# Patient Record
Sex: Male | Born: 1965 | Race: White | Hispanic: No | Marital: Married | Smoking: Never smoker
Health system: Southern US, Community
[De-identification: ages and names within clinical notes are randomized; demographics above are authoritative.]

## PROBLEM LIST (undated history)

## (undated) DIAGNOSIS — E119 Type 2 diabetes mellitus without complications: Secondary | ICD-10-CM

## (undated) DIAGNOSIS — G4730 Sleep apnea, unspecified: Secondary | ICD-10-CM

## (undated) HISTORY — PX: CHOLECYSTECTOMY: SHX55

---

## 2014-04-12 ENCOUNTER — Emergency Department
Admit: 2014-04-12 | Disposition: A | Discharge status: Home / Self Care | Payer: Self-pay | Admitting: Emergency Medicine

## 2014-09-19 LAB — HM HIV SCREENING LAB: HM HIV Screening: NEGATIVE

## 2014-10-16 ENCOUNTER — Ambulatory Visit: Payer: Self-pay | Admitting: Family Medicine

## 2015-03-10 ENCOUNTER — Telehealth: Payer: Self-pay | Admitting: Gastroenterology

## 2015-03-10 ENCOUNTER — Other Ambulatory Visit: Payer: Self-pay

## 2015-03-10 NOTE — Telephone Encounter (Signed)
Pt scheduled for screening colonoscopy at [facility] on 03/19/15. Instructs/rx mailed. Please check precert.

## 2015-03-10 NOTE — Telephone Encounter (Signed)
Gastroenterology Pre-Procedure Review  Request Date:  Requesting Physician: Dr.   PATIENT REVIEW QUESTIONS: The patient responded to the following health history questions as indicated:    1. Are you having any GI issues? no 2. Do you have a personal history of Polyps? no 3. Do you have a family history of Colon Cancer or Polyps? no 4. Diabetes Mellitus? no 5. Joint replacements in the past 12 months?no 6. Major health problems in the past 3 months?no 7. Any artificial heart valves, MVP, or defibrillator?no    MEDICATIONS & ALLERGIES:    Patient reports the following regarding taking any anticoagulation/antiplatelet therapy:   Plavix, Coumadin, Eliquis, Xarelto, Lovenox, Pradaxa, Brilinta, or Effient? no Aspirin? no  Patient confirms/reports the following medications:  No current outpatient prescriptions on file.   No current facility-administered medications for this visit.    Patient confirms/reports the following allergies:  Allergies not on file  No orders of the defined types were placed in this encounter.    AUTHORIZATION INFORMATION Primary Insurance: 1D#: Group #:  Secondary Insurance: 1D#: Group #:  SCHEDULE INFORMATION: Date: 03/19/15 Time: Location: [place]

## 2015-03-10 NOTE — Telephone Encounter (Signed)
Self Referral. Patient has [facility] and is over 48, never had a colonoscopy. Please call for colonoscopy triage. Call his cell# [phone]. Thanks.

## 2015-03-12 ENCOUNTER — Encounter: Payer: Self-pay | Admitting: *Deleted

## 2015-03-13 ENCOUNTER — Telehealth: Payer: Self-pay | Admitting: Gastroenterology

## 2015-03-13 NOTE — Telephone Encounter (Signed)
Patient did not receive any info and RX in the mail for his colonoscopy. Please call

## 2015-03-16 NOTE — Telephone Encounter (Signed)
Contacted pt and he had received his paperwork already. No need to resend.

## 2015-03-18 NOTE — Discharge Instructions (Signed)

## 2015-03-19 ENCOUNTER — Ambulatory Visit: Payer: BLUE CROSS/BLUE SHIELD | Admitting: Anesthesiology

## 2015-03-19 ENCOUNTER — Ambulatory Visit
Admission: RE | Admit: 2015-03-19 | Discharge: 2015-03-19 | Disposition: A | Discharge status: Home / Self Care | Payer: BLUE CROSS/BLUE SHIELD | Source: Ambulatory Visit | Attending: Gastroenterology | Admitting: Gastroenterology

## 2015-03-19 ENCOUNTER — Encounter
Admission: RE | Disposition: A | Discharge status: Home / Self Care | Payer: Self-pay | Source: Ambulatory Visit | Attending: Gastroenterology

## 2015-03-19 DIAGNOSIS — Z1211 Encounter for screening for malignant neoplasm of colon: Secondary | ICD-10-CM | POA: Diagnosis not present

## 2015-03-19 DIAGNOSIS — D125 Benign neoplasm of sigmoid colon: Secondary | ICD-10-CM | POA: Insufficient documentation

## 2015-03-19 DIAGNOSIS — K635 Polyp of colon: Secondary | ICD-10-CM | POA: Insufficient documentation

## 2015-03-19 DIAGNOSIS — Z9049 Acquired absence of other specified parts of digestive tract: Secondary | ICD-10-CM | POA: Insufficient documentation

## 2015-03-19 DIAGNOSIS — D124 Benign neoplasm of descending colon: Secondary | ICD-10-CM | POA: Diagnosis not present

## 2015-03-19 HISTORY — PX: COLONOSCOPY WITH PROPOFOL: SHX5780

## 2015-03-19 HISTORY — PX: POLYPECTOMY: SHX5525

## 2015-03-19 SURGERY — COLONOSCOPY WITH PROPOFOL
Anesthesia: Monitor Anesthesia Care | Wound class: Contaminated

## 2015-03-19 MED ORDER — ONDANSETRON HCL 4 MG/2ML IJ SOLN: 4.0000 mg | Freq: Once | INTRAMUSCULAR | Status: DC | PRN

## 2015-03-19 MED ORDER — ACETAMINOPHEN 325 MG PO TABS: 325.0000 mg | ORAL_TABLET | ORAL | Status: DC | PRN

## 2015-03-19 MED ORDER — LIDOCAINE HCL (CARDIAC) 20 MG/ML IV SOLN
INTRAVENOUS | Status: DC | PRN
  Administered 2015-03-19: 50 mg via INTRAVENOUS

## 2015-03-19 MED ORDER — ACETAMINOPHEN 160 MG/5ML PO SOLN: 325.0000 mg | ORAL | Status: DC | PRN

## 2015-03-19 MED ORDER — PROPOFOL 10 MG/ML IV BOLUS
INTRAVENOUS | Status: DC | PRN
  Administered 2015-03-19: 20 mg via INTRAVENOUS
  Administered 2015-03-19: 30 mg via INTRAVENOUS
  Administered 2015-03-19 (×2): 20 mg via INTRAVENOUS
  Administered 2015-03-19: 30 mg via INTRAVENOUS
  Administered 2015-03-19: 150 mg via INTRAVENOUS
  Administered 2015-03-19 (×2): 20 mg via INTRAVENOUS
  Administered 2015-03-19: 50 mg via INTRAVENOUS

## 2015-03-19 MED ORDER — LACTATED RINGERS IV SOLN
INTRAVENOUS | Status: DC
  Administered 2015-03-19: 10:00:00 via INTRAVENOUS

## 2015-03-19 MED ORDER — STERILE WATER FOR IRRIGATION IR SOLN
Status: DC | PRN
  Administered 2015-03-19: 10:00:00

## 2015-03-19 SURGICAL SUPPLY — 28 items

## 2015-03-19 NOTE — Transfer of Care (Signed)
Immediate Anesthesia Transfer of Care Note  Patient: Sergio Fleming  Procedure(s) Performed: Procedure(s): COLONOSCOPY WITH PROPOFOL (N/A) POLYPECTOMY  Patient Location: PACU  Anesthesia Type: MAC  Level of Consciousness: awake, alert  and patient cooperative  Airway and Oxygen Therapy: Patient Spontanous Breathing and Patient connected to supplemental oxygen  Post-op Assessment: Post-op Vital signs reviewed, Patient's Cardiovascular Status Stable, Respiratory Function Stable, Patent Airway and No signs of Nausea or vomiting  Post-op Vital Signs: Reviewed and stable  Complications: No apparent anesthesia complications

## 2015-03-19 NOTE — Addendum Note (Signed)
Addendum  created 03/19/15 1102 by Mayme Genta, CRNA   Modules edited: Notes Section   Notes Section:  Pend: [id number]

## 2015-03-19 NOTE — Op Note (Signed)
[facility] Gastroenterology Patient Name: Sergio Fleming Procedure Date: 03/19/2015 9:55 AM MRN: [id number] Account #: [id number] Date of Birth: Jul 28, 1965 Admit Type: Outpatient Age: 50 Room: [facility] OR ROOM 01 Gender: Male Note Status: Finalized Procedure:            Colonoscopy Indications:          Screening for colorectal malignant neoplasm Providers:            Lucilla Lame, MD Referring MD:         Scot [facility] (Referring MD) Medicines:            Propofol per Anesthesia Complications:        No immediate complications. Procedure:            Pre-Anesthesia Assessment:                       - Prior to the procedure, a History and Physical was                        performed, and patient medications and allergies were                        reviewed. The patient's tolerance of previous                        anesthesia was also reviewed. The risks and benefits of                        the procedure and the sedation options and risks were                        discussed with the patient. All questions were                        answered, and informed consent was obtained. Prior                        Anticoagulants: The patient has taken no previous                        anticoagulant or antiplatelet agents. ASA Grade                        Assessment: II - A patient with mild systemic disease.                        After reviewing the risks and benefits, the patient was                        deemed in satisfactory condition to undergo the                        procedure.                       After obtaining informed consent, the colonoscope was                        passed under direct vision. Throughout the procedure,  the patient's blood pressure, pulse, and oxygen                        saturations were monitored continuously. The was                        introduced through the anus and advanced to the the      cecum, identified by appendiceal orifice and ileocecal                        valve. The colonoscopy was performed without                        difficulty. The patient tolerated the procedure well.                        The quality of the bowel preparation was excellent. Findings:      The perianal and digital rectal examinations were normal.      A 3 mm polyp was found in the sigmoid colon. The polyp was sessile. The       polyp was removed with a cold snare. Resection and retrieval were       complete.      A 6 mm polyp was found in the descending colon. The polyp was sessile.       The polyp was removed with a cold snare. Resection and retrieval were       complete. Impression:           - One 3 mm polyp in the sigmoid colon, removed with a                        cold snare. Resected and retrieved.                       - One 6 mm polyp in the descending colon, removed with                        a cold snare. Resected and retrieved. Recommendation:       - Await pathology results.                       - Repeat colonoscopy in 5 years if polyp adenoma and 10                        years if hyperplastic Procedure Code(s):    --- Professional ---                       [phone], Colonoscopy, flexible; with removal of tumor(s),                        polyp(s), or other lesion(s) by snare technique Diagnosis Code(s):    --- Professional ---                       Z12.11, Encounter for screening for malignant neoplasm                        of colon  D12.5, Benign neoplasm of sigmoid colon                       D12.4, Benign neoplasm of descending colon CPT copyright 2016 American Medical Association. All rights reserved. The codes documented in this report are preliminary and upon coder review may  be revised to meet current compliance requirements. Lucilla Lame, MD 03/19/2015 10:17:39 AM This report has been signed electronically. Number of Addenda: 0 Note  Initiated On: 03/19/2015 9:55 AM Scope Withdrawal Time: 0 hours 6 minutes 12 seconds  Total Procedure Duration: 0 hours 10 minutes 36 seconds       [facility]

## 2015-03-19 NOTE — Anesthesia Postprocedure Evaluation (Signed)
Anesthesia Post Note  Patient: Sergio Fleming  Procedure(s) Performed: Procedure(s) (LRB): COLONOSCOPY WITH PROPOFOL (N/A) POLYPECTOMY  Patient location during evaluation: PACU Anesthesia Type: MAC Level of consciousness: awake and alert Pain management: pain level controlled Vital Signs Assessment: post-procedure vital signs reviewed and stable Respiratory status: spontaneous breathing, nonlabored ventilation, respiratory function stable and patient connected to nasal cannula oxygen Cardiovascular status: stable and blood pressure returned to baseline Anesthetic complications: no    Amaryllis Dyke

## 2015-03-19 NOTE — Anesthesia Procedure Notes (Signed)
Procedure Name: MAC Date/Time: 03/19/2015 10:00 AM Performed by: Cameron Ali Pre-anesthesia Checklist: Patient identified, Emergency Drugs available, Suction available, Timeout performed and Patient being monitored Patient Re-evaluated:Patient Re-evaluated prior to inductionOxygen Delivery Method: Nasal cannula Placement Confirmation: positive ETCO2

## 2015-03-19 NOTE — Anesthesia Preprocedure Evaluation (Signed)
Anesthesia Evaluation  Patient identified by MRN, date of birth, ID band Patient awake    Reviewed: Allergy & Precautions, H&P , NPO status   Airway Mallampati: II  TM Distance: >3 FB Neck ROM: full    Dental   Pulmonary    Pulmonary exam normal        Cardiovascular Normal cardiovascular exam     Neuro/Psych    GI/Hepatic   Endo/Other    Renal/GU      Musculoskeletal   Abdominal   Peds  Hematology   Anesthesia Other Findings   Reproductive/Obstetrics                             Anesthesia Physical Anesthesia Plan  ASA: II  Anesthesia Plan: MAC   Post-op Pain Management:    Induction:   Airway Management Planned:   Additional Equipment:   Intra-op Plan:   Post-operative Plan:   Informed Consent: I have reviewed the patients History and Physical, chart, labs and discussed the procedure including the risks, benefits and alternatives for the proposed anesthesia with the patient or authorized representative who has indicated his/her understanding and acceptance.     Plan Discussed with: CRNA  Anesthesia Plan Comments:         Anesthesia Quick Evaluation

## 2015-03-19 NOTE — H&P (Signed)
  [facility]  [address], [place], [place] [address] Phone: [phone] Fax : [phone]  Primary Care Physician:  Nathaneil Canary, PA-C Primary Gastroenterologist:  Dr. Allen Norris  Pre-Procedure History & Physical: HPI:  Sergio Fleming is a 50 y.o. male is here for a screening colonoscopy.   History reviewed. No pertinent past medical history.  Past Surgical History  Procedure Laterality Date  . Cholecystectomy      Prior to Admission medications   Not on File    Allergies as of 03/10/2015  . (No Known Allergies)    History reviewed. No pertinent family history.  Social History   Social History  . Marital Status: Married    Spouse Name: N/A  . Number of Children: N/A  . Years of Education: N/A   Occupational History  . Not on file.   Social History Main Topics  . Smoking status: Never Smoker   . Smokeless tobacco: Not on file  . Alcohol Use: No     Comment: rare  . Drug Use: Not on file  . Sexual Activity: Not on file   Other Topics Concern  . Not on file   Social History Narrative  . No narrative on file    Review of Systems: See HPI, otherwise negative ROS  Physical Exam: BP 128/88 mmHg  Pulse 73  Temp(Src) 97.9 F (36.6 C) (Temporal)  Resp 16  Ht 5\' 9"  (1.753 m)  Wt 225 lb (102.059 kg)  BMI 33.21 kg/m2  SpO2 96% General:   Alert,  pleasant and cooperative in NAD Head:  Normocephalic and atraumatic. Neck:  Supple; no masses or thyromegaly. Lungs:  Clear throughout to auscultation.    Heart:  Regular rate and rhythm. Abdomen:  Soft, nontender and nondistended. Normal bowel sounds, without guarding, and without rebound.   Neurologic:  Alert and  oriented x4;  grossly normal neurologically.  Impression/Plan: Sergio Fleming is now here to undergo a screening colonoscopy.  Risks, benefits, and alternatives regarding colonoscopy have been reviewed with the patient.  Questions have been answered.  All parties agreeable.

## 2015-03-20 ENCOUNTER — Encounter: Payer: Self-pay | Admitting: Gastroenterology

## 2015-03-24 ENCOUNTER — Encounter: Payer: Self-pay | Admitting: Gastroenterology

## 2015-09-09 ENCOUNTER — Encounter: Payer: Self-pay | Admitting: General Surgery

## 2015-09-21 ENCOUNTER — Ambulatory Visit (INDEPENDENT_AMBULATORY_CARE_PROVIDER_SITE_OTHER): Payer: BLUE CROSS/BLUE SHIELD | Admitting: General Surgery

## 2015-09-21 ENCOUNTER — Encounter: Payer: Self-pay | Admitting: General Surgery

## 2015-09-21 VITALS — BP 122/76 | HR 74 | Resp 12 | Ht 68.0 in | Wt 231.0 lb

## 2015-09-21 DIAGNOSIS — R2231 Localized swelling, mass and lump, right upper limb: Secondary | ICD-10-CM | POA: Diagnosis not present

## 2015-09-21 DIAGNOSIS — R2232 Localized swelling, mass and lump, left upper limb: Secondary | ICD-10-CM | POA: Diagnosis not present

## 2015-09-21 DIAGNOSIS — R2230 Localized swelling, mass and lump, unspecified upper limb: Secondary | ICD-10-CM | POA: Insufficient documentation

## 2015-09-21 NOTE — Progress Notes (Signed)
Patient ID: Sergio Fleming, male   DOB: 11-22-1965, 50 y.o.   MRN: [id number]  Chief Complaint  Patient presents with  . Other    left arm cyst    HPI Sergio Fleming is a 50 y.o. male here today for a evaluation of a left arm masses.  Patient states he noticed this areas over ten years ago. He has had some of them masses removed over 20 years ago. He states the area has got bigger over the last year. No pain, Some discomfort with direct pressure.Marland Kitchen  HPI  History reviewed. No pertinent past medical history.  Past Surgical History:  Procedure Laterality Date  . CHOLECYSTECTOMY    . COLONOSCOPY WITH PROPOFOL N/A 03/19/2015   Procedure: COLONOSCOPY WITH PROPOFOL;  Surgeon: Lucilla Lame, MD;  Location: [place];  Service: Endoscopy;  Laterality: N/A;  . POLYPECTOMY  03/19/2015   Procedure: POLYPECTOMY;  Surgeon: Lucilla Lame, MD;  Location: [place];  Service: Endoscopy;;    History reviewed. No pertinent family history.  Social History Social History  Substance Use Topics  . Smoking status: Never Smoker  . Smokeless tobacco: Not on file  . Alcohol use No     Comment: rare    No Known Allergies  Current Outpatient Prescriptions  Medication Sig Dispense Refill  . metroNIDAZOLE (FLAGYL) 500 MG tablet      No current facility-administered medications for this visit.     Review of Systems Review of Systems  Constitutional: Negative.   Respiratory: Negative.   Cardiovascular: Negative.     Blood pressure 122/76, pulse 74, resp. rate 12, height 5\' 8"  (1.727 m), weight 231 lb (104.8 kg).  Physical Exam Physical Exam  Constitutional: He is oriented to person, place, and time. He appears well-developed and well-nourished.  Eyes: Conjunctivae are normal. No scleral icterus.  Neck: Neck supple.  Cardiovascular: Normal rate and regular rhythm.   Pulmonary/Chest: Effort normal and breath sounds normal.  Abdominal: There is no tenderness.  Musculoskeletal:   Back:       Arms: Lymphadenopathy:    He has no cervical adenopathy.  Neurological: He is alert and oriented to person, place, and time.  Skin: Skin is warm and dry.  Left 1 cm laterally elbow mass. 1.5 cm laterally forearm  2 mass on the distal left forearm about 1 cm .    Right distal upper arm 1 cm mass.   Left lower back soft tissue mass consistent with lipoma.      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    Plan    The patient previously had a lipoma removed under local anesthetic and tolerated it well.  We'll plan to remove these lesions in the near future under local anesthesia.  The patient does long-haul trucking. Importance of not needing to drive for several days until he is pain-free was reviewed.    Patient to return for excision forearm masses.  This information has been scribed by Gaspar Cola CMA.   Sergio Fleming 09/21/2015, 4:44 PM

## 2015-09-21 NOTE — Patient Instructions (Signed)
Patient to return for excision forearm masses.

## 2015-10-06 ENCOUNTER — Encounter: Payer: Self-pay | Admitting: General Surgery

## 2015-10-06 ENCOUNTER — Ambulatory Visit (INDEPENDENT_AMBULATORY_CARE_PROVIDER_SITE_OTHER): Payer: BLUE CROSS/BLUE SHIELD | Admitting: General Surgery

## 2015-10-06 VITALS — BP 112/68 | HR 62 | Resp 12 | Ht 68.0 in | Wt 227.0 lb

## 2015-10-06 DIAGNOSIS — R2232 Localized swelling, mass and lump, left upper limb: Secondary | ICD-10-CM | POA: Diagnosis not present

## 2015-10-06 NOTE — Progress Notes (Signed)
Patient ID: Sergio Fleming, male   DOB: [birth year], 50 y.o.   MRN: [id number]  Chief Complaint  Patient presents with  . Procedure    excision    HPI Sergio Fleming is a 50 y.o. male.  Here today for excision left arm mass, elbow and forearm.  HPI  No past medical history on file.  Past Surgical History:  Procedure Laterality Date  . CHOLECYSTECTOMY    . COLONOSCOPY WITH PROPOFOL N/A 03/19/2015   Procedure: COLONOSCOPY WITH PROPOFOL;  Surgeon: Lucilla Lame, MD;  Location: [place];  Service: Endoscopy;  Laterality: N/A;  . POLYPECTOMY  03/19/2015   Procedure: POLYPECTOMY;  Surgeon: Lucilla Lame, MD;  Location: [place];  Service: Endoscopy;;    No family history on file.  Social History Social History  Substance Use Topics  . Smoking status: Never Smoker  . Smokeless tobacco: Never Used  . Alcohol use No     Comment: rare    No Known Allergies  No current outpatient prescriptions on file.   No current facility-administered medications for this visit.     Review of Systems Review of Systems  Constitutional: Negative.   Respiratory: Negative.   Cardiovascular: Negative.     Blood pressure 112/68, pulse 62, resp. rate 12, height 5\' 8"  (1.727 m), weight 227 lb (103 kg).  Physical Exam Physical Exam  Musculoskeletal:       Arms:     Assessment    It was elected to excise the 2 most symptomatic lesions, one above the elbow and the other in the proximal medial left forearm.    Plan    The sites were prepped with alcohol followed by 10 mL of 0.5% Xylocaine with 0.25% Marcaine with 1-200,000 units at each location. A transverse skin line incision was made in the area above the elbow and a 1.5 cm soft tissue mass excised. An overlying vein was controlled with 3-0 Vicryl tie. The wound was closed with interrupted 4-0 Vicryl subcuticular sutures. Benzoin, Steri-Strips, Telfa and Tegaderm applied.  The second site in the proximal medial forearm was  managed in a similar fashion. A radial incision was used along the long and so the forearm. A 1.8 cm soft tissue mass consistent with a lipoma was removed. This as well as the area above the elbow were sent for routine histology. No bleeding was noted. The skin defect was closed with a rapid 4-0 Vicryl subcuticular sutures. A similar dressing as noted above was applied.  The patient was encouraged to make use of localized to minimize swelling. OTC anti-inflammatories/analgesics for comfort.  Use encouraged to report any difficulty with wound healing. Outer plastic dressing may be removed in 3 days.     This information has been scribed by Karie Fetch RN, BSN,BC.   Sergio Fleming 10/06/2015, 9:22 PM

## 2015-10-06 NOTE — Patient Instructions (Addendum)
The patient is aware to call back for any questions or concerns. Keep area clean May shower May remove dressing in 2-3 days the steri strips will gradually fall off

## 2015-10-12 ENCOUNTER — Telehealth: Payer: Self-pay | Admitting: *Deleted

## 2015-10-12 NOTE — Telephone Encounter (Signed)
Called patients cell,unable to leave message, it kept ringing. Please let patient know pathology was fine.

## 2016-12-21 ENCOUNTER — Ambulatory Visit: Payer: BLUE CROSS/BLUE SHIELD | Admitting: Family Medicine

## 2017-03-02 ENCOUNTER — Ambulatory Visit: Payer: BLUE CROSS/BLUE SHIELD | Admitting: Family Medicine

## 2017-03-03 ENCOUNTER — Ambulatory Visit: Payer: BLUE CROSS/BLUE SHIELD | Admitting: Family Medicine

## 2017-03-06 ENCOUNTER — Ambulatory Visit: Payer: BLUE CROSS/BLUE SHIELD | Admitting: Family Medicine

## 2017-03-14 ENCOUNTER — Encounter: Payer: Self-pay | Admitting: Family Medicine

## 2017-03-14 ENCOUNTER — Ambulatory Visit (INDEPENDENT_AMBULATORY_CARE_PROVIDER_SITE_OTHER): Payer: BLUE CROSS/BLUE SHIELD | Admitting: Family Medicine

## 2017-03-14 VITALS — BP 124/70 | HR 91 | Temp 98.8°F | Resp 18 | Ht 68.0 in | Wt 224.2 lb

## 2017-03-14 DIAGNOSIS — Z7689 Persons encountering health services in other specified circumstances: Secondary | ICD-10-CM | POA: Diagnosis not present

## 2017-03-14 DIAGNOSIS — E66811 Obesity, class 1: Secondary | ICD-10-CM

## 2017-03-14 DIAGNOSIS — Z1159 Encounter for screening for other viral diseases: Secondary | ICD-10-CM | POA: Diagnosis not present

## 2017-03-14 DIAGNOSIS — G4733 Obstructive sleep apnea (adult) (pediatric): Secondary | ICD-10-CM

## 2017-03-14 DIAGNOSIS — Z131 Encounter for screening for diabetes mellitus: Secondary | ICD-10-CM | POA: Diagnosis not present

## 2017-03-14 DIAGNOSIS — Z113 Encounter for screening for infections with a predominantly sexual mode of transmission: Secondary | ICD-10-CM

## 2017-03-14 DIAGNOSIS — Z0000 Encounter for general adult medical examination without abnormal findings: Secondary | ICD-10-CM

## 2017-03-14 DIAGNOSIS — Z13220 Encounter for screening for lipoid disorders: Secondary | ICD-10-CM

## 2017-03-14 DIAGNOSIS — E669 Obesity, unspecified: Secondary | ICD-10-CM | POA: Diagnosis not present

## 2017-03-14 NOTE — Progress Notes (Signed)
Name: Sergio Fleming   MRN: [id number]    DOB: 22-Dec-1965   Date:03/14/2017       Progress Note  Subjective  Chief Complaint  Chief Complaint  Patient presents with  . Establish Care    HPI  Patient presents for annual CPE and to establish care - states he has no concerns today.  USPSTF grade A and B recommendations:  Diet: 110mos ago changed his diet and lost 20lbs; eating fruits and veggies daily; rarely eating meat of bread.  Exercise: Mainly walking and some weights; walks about 5 miles a day.  Depression: Notes always having some stress related to his job - had a very bad day yesterday but denies ongoing depressive symptoms. Depression screen [facility] 2/9 03/14/2017  Decreased Interest 0  Down, Depressed, Hopeless 0  PHQ - 2 Score 0    Hypertension:  BP Readings from Last 3 Encounters:  03/14/17 124/70  10/06/15 112/68  09/21/15 122/76    Obesity: Wt Readings from Last 3 Encounters:  03/14/17 224 lb 3.2 oz (101.7 kg)  10/06/15 227 lb (103 kg)  09/21/15 231 lb (104.8 kg)   BMI Readings from Last 3 Encounters:  03/14/17 34.09 kg/m  10/06/15 34.52 kg/m  09/21/15 35.12 kg/m   Lipids:  No results found for: CHOL No results found for: HDL No results found for: LDLCALC No results found for: TRIG No results found for: CHOLHDL No results found for: LDLDIRECT Glucose:  No results found for: GLUCOSE, GLUCAP  Alcohol: Rarely Tobacco use: Never smoker  Married STD testing and prevention (chl/gon/syphilis):  HIV, hep C: Will check today  Skin cancer: No concerning moles or lesions - saw Dermatology on 02/22/2017 for skin survery Colorectal cancer: Had colonoscopy with Dr. Allen Norris in 2017, 5 year follow up; denies blood in stool, dark/tarry stools, mucous in stools. Prostate cancer: AUA score is 3, no family or personal history of prostate cancer.  Will not check PSA today. No results found for: PSA  IPSS Questionnaire (AUA-7): Over the past month.   1)  How often have  you had a sensation of not emptying your bladder completely after you finish urinating?  0 - Not at all  2)  How often have you had to urinate again less than two hours after you finished urinating? 2 - Less than half the time  3)  How often have you found you stopped and started again several times when you urinated?  0 - Not at all  4) How difficult have you found it to postpone urination?  0 - Not at all  5) How often have you had a weak urinary stream?  0 - Not at all  6) How often have you had to push or strain to begin urination?  0 - Not at all  7) How many times did you most typically get up to urinate from the time you went to bed until the time you got up in the morning?  1 - 1 time  Total score:  0-7 mildly symptomatic   8-19 moderately symptomatic   20-35 severely symptomatic   Aspirin: Will check lipid panel today to determine if needed. ECG: Denies chest pain, palpitations, shortness of breath, or other concerns from a cardiac standpoint.  We will not perform today.  Patient Active Problem List   Diagnosis Date Noted  . Forearm mass 09/21/2015  . Special screening for malignant neoplasms, colon   . Benign neoplasm of sigmoid colon   . Benign neoplasm of  descending colon     Past Surgical History:  Procedure Laterality Date  . CHOLECYSTECTOMY    . COLONOSCOPY WITH PROPOFOL N/A 03/19/2015   Procedure: COLONOSCOPY WITH PROPOFOL;  Surgeon: Lucilla Lame, MD;  Location: [place];  Service: Endoscopy;  Laterality: N/A;  . POLYPECTOMY  03/19/2015   Procedure: POLYPECTOMY;  Surgeon: Lucilla Lame, MD;  Location: [place];  Service: Endoscopy;;    Family History  Problem Relation Age of Onset  . Ovarian cancer Mother   . Pancreatic cancer Father   . Brain cancer Sister     Social History   Socioeconomic History  . Marital status: Married    Spouse name: Not on file  . Number of children: Not on file  . Years of education: Not on file  . Highest  education level: Not on file  Social Needs  . Financial resource strain: Not on file  . Food insecurity - worry: Not on file  . Food insecurity - inability: Not on file  . Transportation needs - medical: Not on file  . Transportation needs - non-medical: Not on file  Occupational History  . Not on file  Tobacco Use  . Smoking status: Never Smoker  . Smokeless tobacco: Never Used  Substance and Sexual Activity  . Alcohol use: No    Comment: rare  . Drug use: No  . Sexual activity: No    Partners: Female  Other Topics Concern  . Not on file  Social History Narrative  . Not on file    No current outpatient medications on file.  No Known Allergies   ROS  Constitutional: Negative for fever or weight change.  Respiratory: Negative for cough and shortness of breath.   Cardiovascular: Negative for chest pain or palpitations.  Gastrointestinal: Negative for abdominal pain, no bowel changes.  Musculoskeletal: Negative for gait problem or joint swelling.  Skin: Negative for rash.  Neurological: Negative for dizziness or headache.  No other specific complaints in a complete review of systems (except as listed in HPI above).   Objective  Vitals:   03/14/17 0833  BP: 124/70  Pulse: 91  Resp: 18  Temp: 98.8 F (37.1 C)  TempSrc: Oral  SpO2: 98%  Weight: 224 lb 3.2 oz (101.7 kg)  Height: 5\' 8"  (1.727 m)    Body mass index is 34.09 kg/m.  Physical Exam Constitutional: Patient appears well-developed and well-nourished. No distress.  HENT: Head: Normocephalic and atraumatic. Ears: B TMs ok, no erythema or effusion; Nose: Nose normal. Mouth/Throat: Oropharynx is clear and moist. No oropharyngeal exudate.  Eyes: Conjunctivae and EOM are normal. Pupils are equal, round, and reactive to light. No scleral icterus.  Neck: Normal range of motion. Neck supple. No JVD present. No thyromegaly present.  Cardiovascular: Normal rate, regular rhythm and normal heart sounds.  No murmur  heard. No BLE edema. Pulmonary/Chest: Effort normal and breath sounds normal. No respiratory distress. Abdominal: Soft. Bowel sounds are normal, no distension. There is no tenderness. no masses MALE GENITALIA: Deferred RECTAL: Deferred Musculoskeletal: Normal range of motion, no joint effusions. No gross deformities Neurological: he is alert and oriented to person, place, and time. No cranial nerve deficit. Coordination, balance, strength, speech and gait are normal.  Skin: Skin is warm and dry. No rash noted. No erythema.  Psychiatric: Patient has a normal mood and affect. behavior is normal. Judgment and thought content normal.  No results found for this or any previous visit (from the past 2160 hour(s)).  PHQ2/9: Depression screen PHQ 2/9 03/14/2017  Decreased Interest 0  Down, Depressed, Hopeless 0  PHQ - 2 Score 0   Fall Risk: Fall Risk  03/14/2017  Falls in the past year? No   Functional Status Survey: Is the patient deaf or have difficulty hearing?: No Does the patient have difficulty seeing, even when wearing glasses/contacts?: No Does the patient have difficulty concentrating, remembering, or making decisions?: No Does the patient have difficulty walking or climbing stairs?: No Does the patient have difficulty dressing or bathing?: No Does the patient have difficulty doing errands alone such as visiting a doctor's office or shopping?: No   Assessment & Plan  1. Annual physical exam -Prostate cancer screening and PSA options (with potential risks and benefits of testing vs not testing) were discussed along with recent recs/guidelines. -USPSTF grade A and B recommendations reviewed with patient; age-appropriate recommendations, preventive care, screening tests, etc discussed and encouraged; healthy living encouraged; see AVS for patient education given to patient -Discussed importance of 150 minutes of physical activity weekly, eat two servings of fish weekly, eat one serving  of tree nuts ( cashews, pistachios, pecans, almonds.Marland Kitchen) every other day, eat 6 servings of fruit/vegetables daily and drink plenty of water and avoid sweet beverages.   2. Encounter to establish care - Follow up in 1 year for CPE; will schedule closer follow up if needed based on labs.  3. Screen for STD (sexually transmitted disease) - HIV antibody - RPR - C. trachomatis/N. gonorrhoeae RNA  4. Need for hepatitis C screening test - Hepatitis C antibody  5. Screening for hyperlipidemia - Lipid panel  6. Obesity (BMI 30.0-34.9) - Lipid panel - COMPLETE METABOLIC PANEL WITH GFR - Discussed lifestyle modifications as above; encouraged his continued weight loss, healthy eating, and regular exercise  7. Diabetes mellitus screening - COMPLETE METABOLIC PANEL WITH GFR

## 2017-03-14 NOTE — Patient Instructions (Addendum)

## 2017-03-15 LAB — COMPLETE METABOLIC PANEL WITH GFR
AG Ratio: 1.6 (calc) (ref 1.0–2.5)
ALT: 29 U/L (ref 9–46)
AST: 20 U/L (ref 10–35)
Albumin: 4.4 g/dL (ref 3.6–5.1)
Alkaline phosphatase (APISO): 76 U/L (ref 40–115)
BUN: 17 mg/dL (ref 7–25)
CO2: 29 mmol/L (ref 20–32)
Calcium: 9.8 mg/dL (ref 8.6–10.3)
Chloride: 101 mmol/L (ref 98–110)
Creat: 1.11 mg/dL (ref 0.70–1.33)
GFR, EST NON AFRICAN AMERICAN: 75 mL/min/{1.73_m2} (ref >=60)
GFR, Est African American: 87 mL/min/{1.73_m2} (ref >=60)
GLOBULIN: 2.7 g/dL (ref 1.9–3.7)
GLUCOSE: 116 mg/dL — AB (ref 65–99)
Potassium: 4.5 mmol/L (ref 3.5–5.3)
SODIUM: 138 mmol/L (ref 135–146)
Total Bilirubin: 0.6 mg/dL (ref 0.2–1.2)
Total Protein: 7.1 g/dL (ref 6.1–8.1)

## 2017-03-15 LAB — LIPID PANEL
CHOL/HDL RATIO: 5.8 (calc) — AB (ref <5.0)
CHOLESTEROL: 276 mg/dL — AB (ref <200)
HDL: 48 mg/dL (ref >40)
LDL Cholesterol (Calc): 189 mg/dL (calc) — ABNORMAL HIGH
Non-HDL Cholesterol (Calc): 228 mg/dL (calc) — ABNORMAL HIGH (ref <130)
Triglycerides: 201 mg/dL — ABNORMAL HIGH (ref <150)

## 2017-03-15 LAB — HIV ANTIBODY (ROUTINE TESTING W REFLEX): HIV: NONREACTIVE

## 2017-03-15 LAB — C. TRACHOMATIS/N. GONORRHOEAE RNA
C. TRACHOMATIS RNA, TMA: NOT DETECTED
N. gonorrhoeae RNA, TMA: NOT DETECTED

## 2017-03-15 LAB — HEPATITIS C ANTIBODY
HEP C AB: NONREACTIVE
SIGNAL TO CUT-OFF: 0.04 (ref <1.00)

## 2017-03-15 LAB — RPR: RPR: NONREACTIVE

## 2017-03-16 ENCOUNTER — Other Ambulatory Visit: Payer: Self-pay | Admitting: Family Medicine

## 2017-03-16 DIAGNOSIS — R739 Hyperglycemia, unspecified: Secondary | ICD-10-CM

## 2017-04-05 ENCOUNTER — Other Ambulatory Visit: Payer: Self-pay | Admitting: Nurse Practitioner

## 2017-04-05 ENCOUNTER — Ambulatory Visit
Admission: RE | Admit: 2017-04-05 | Discharge: 2017-04-05 | Disposition: A | Discharge status: Home / Self Care | Payer: BLUE CROSS/BLUE SHIELD | Source: Ambulatory Visit | Attending: Nurse Practitioner | Admitting: Nurse Practitioner

## 2017-04-05 ENCOUNTER — Telehealth: Payer: Self-pay | Admitting: Nurse Practitioner

## 2017-04-05 ENCOUNTER — Ambulatory Visit (INDEPENDENT_AMBULATORY_CARE_PROVIDER_SITE_OTHER): Payer: BLUE CROSS/BLUE SHIELD | Admitting: Nurse Practitioner

## 2017-04-05 ENCOUNTER — Encounter: Payer: Self-pay | Admitting: Nurse Practitioner

## 2017-04-05 VITALS — BP 110/68 | HR 92 | Temp 98.8°F | Resp 18 | Ht 68.0 in | Wt 222.5 lb

## 2017-04-05 DIAGNOSIS — R05 Cough: Secondary | ICD-10-CM

## 2017-04-05 DIAGNOSIS — R059 Cough, unspecified: Secondary | ICD-10-CM

## 2017-04-05 DIAGNOSIS — R0989 Other specified symptoms and signs involving the circulatory and respiratory systems: Secondary | ICD-10-CM

## 2017-04-05 DIAGNOSIS — J069 Acute upper respiratory infection, unspecified: Secondary | ICD-10-CM | POA: Diagnosis not present

## 2017-04-05 MED ORDER — DOXYCYCLINE HYCLATE 100 MG PO TABS: 100.0000 mg | ORAL_TABLET | Freq: Two times a day (BID) | ORAL | 0 refills | Status: DC

## 2017-04-05 NOTE — Patient Instructions (Addendum)
Go across the street and get a chest xray we will call you with your results and give you antibiotics if needed. Take acetaminophen around the clock for the fist 48 hours and then as needed. And then take guaifenesin daily.    You likely have a viral upper respiratory infection (URI). Antibiotics will not reduce the number of days you are ill or prevent you from getting bacterial rhinosinusitis. A URI can take up to 14 days to resolve, but typically last between 7-11 days. Your body is so smart and strong that it will be fighting this illness off for you but it is important that you drink plenty of fluids, rest. Cover your nose/mouth when you cough or sneeze and wash your hands well and often. Here are some helpful things you can use or pick up over the counter from the pharmacy to help with your symptoms:   For Fever/Pain: Acetaminophen every 6 hours as needed (maximum of 3000mg  a day). If you are still uncomfortable you can add ibuprofen OR naproxen  For coughing: try dextromethorphan for a cough suppressant, and/or a cool mist humidifier, lozenges  For sore throat: saline gargles, honey herbal tea, lozenges, throat spray  To dry out your nose: try an antihistamine like loratadine (non-sedating) or diphenhydramine (sedating) or others To relieve a stuffy nose: try an oral decongestant  Like pseudoephedrine if you are under the age of 62 and do not have high blood pressure, neti pot To make blowing your nose easier: guaifenesin

## 2017-04-05 NOTE — Progress Notes (Addendum)
Name: Sergio Fleming   MRN: [id number]    DOB: 1965/04/19   Date:04/05/2017       Progress Note  Subjective  Chief Complaint  Chief Complaint  Patient presents with  . URI    cough, congested, fever, headache, chills, weak, no energy for 3 days    HPI  Patient endorses fatigue, head fullness, cough, body aches, alternating between feeling hot and cold x 3d days. Moderate severity productive cough- Symptoms are moderate. Endorses pressure between ears Denies sick contacts. Has taken theraflu and vitamin C. Sts feels a little better today. Endorses hoarseness. Denies sore throat, facial pain, teeth pain, problems smelling, vision changes/problems, rash, chest pain or shortness of breath. He had to miss first day of work in 30 years because of fatigue. Denies known tick bites.   Patient Active Problem List   Diagnosis Date Noted  . Hyperglycemia 03/16/2017  . Obesity (BMI 30.0-34.9) 03/14/2017  . OSA (obstructive sleep apnea) 03/14/2017  . Forearm mass 09/21/2015  . Special screening for malignant neoplasms, colon   . Benign neoplasm of sigmoid colon   . Benign neoplasm of descending colon     Social History   Tobacco Use  . Smoking status: Never Smoker  . Smokeless tobacco: Never Used  Substance Use Topics  . Alcohol use: No    Comment: rare    No current outpatient medications on file.  No Known Allergies  ROS  Constitutional: Positive for fever and chill sensation denies weight change.  Respiratory: Positive for cough and denies shortness of breath.   Cardiovascular: Negative for chest pain or palpitations.  Gastrointestinal: Negative for abdominal pain, no bowel changes.  Musculoskeletal: Negative for gait problem or joint swelling.  Skin: Negative for rash.  Neurological: Positive for mild dizziness at times and headache.  No other specific complaints in a complete review of systems (except as listed in HPI above).  Objective  Vitals:   04/05/17 1421  BP: 110/68   Pulse: 92  Resp: 18  Temp: 98.8 F (37.1 C)  TempSrc: Oral  SpO2: 94%  Weight: 222 lb 8 oz (100.9 kg)  Height: 5\' 8"  (1.727 m)     Body mass index is 33.83 kg/m.  Nursing Note and Vital Signs reviewed.  Physical Exam  Constitutional: Patient appears well-developed and well-nourished. Obese No distress.  HEENT: head atraumatic, normocephalic, pupils equal and reactive to light, EOM's intact, TM's without erythema or bulging- some irritation noted to ear canal (pt sts uses qtips),  no maxillary or frontal sinus tenderness , neck supple without lymphadenopathy, oropharynx pink and moist without exudate, nose WNL Cardiovascular: Normal rate, regular rhythm, S1/S2 present.  No murmur or rub heard.  Pulmonary/Chest: Effort normal and breath sounds clear on right, rhonchi on left.  Abdominal: Soft and non-tender, bowel sounds present Psychiatric: Patient has a normal mood and affect. behavior is normal. Judgment and thought content normal.  No results found for this or any previous visit (from the past 72 hour(s)).  Assessment & Plan  1. Cough - lozenges, delsym PRN  - DG Chest 2 View; Future  2. Rhonchi at left lung base - will review xray and order appropriate abx, follow-up - DG Chest 2 View; Future  3. Upper respiratory tract infection, unspecified type - Discussed OTC management of symptom relief - hand hygiene, hydration, nutrition, rest - follow up precautions    -Red flags and when to present for emergency care or RTC including fever >101.30F, chest pain, shortness of breath,  new/worsening/un-resolving symptoms, reviewed with patient at time of visit. Follow up and care instructions discussed and provided in AVS.   Doxy sent in, see phone note I have reviewed this encounter including the documentation in this note and/or discussed this patient with the provider, Suezanne Cheshire DNP AGNP-C. I am certifying that I agree with the content of this note as supervising  physician. Enid Derry, [facility] 04/05/2017, 4:48 PM

## 2017-04-06 NOTE — Telephone Encounter (Signed)
Error

## 2017-04-10 ENCOUNTER — Telehealth: Payer: Self-pay | Admitting: Emergency Medicine

## 2017-04-10 NOTE — Telephone Encounter (Signed)
Patient is returning [place] phone call. Please call back.

## 2017-04-10 NOTE — Telephone Encounter (Signed)
Copied from [place]. Topic: Inquiry >> Apr 06, 2017  8:27 AM Sergio Fleming, NT wrote: Patient is calling to get xray results. Please advise.

## 2017-04-10 NOTE — Telephone Encounter (Signed)
Left message for patient to call.

## 2017-04-11 NOTE — Telephone Encounter (Signed)
Please advise 

## 2017-04-11 NOTE — Telephone Encounter (Signed)
Patient stated he is getting better, still have mild cough and congestion was only taking otc medication. Do he need to get more and continue.

## 2017-04-12 NOTE — Telephone Encounter (Signed)
Called this number and it goes to a [place]

## 2017-04-12 NOTE — Telephone Encounter (Signed)
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  Sergio Fleming bought the medicine over counter and is feeling better.   Sergio Fleming said Sergio Fleming had a good experience seeing Suezanne Cheshire

## 2017-04-12 NOTE — Telephone Encounter (Signed)
Patient did not get script for doxcycline. He stated he used OTC medication and feeling better. He stated he did not go to pharmacy because he thought he was not going to  get anything called in

## 2018-07-15 IMAGING — CR DG CHEST 2V
1 series · 3 of 3 positions shown · non-contrast
Comparison: None.

CLINICAL DATA: Cough for several days

EXAM:
CHEST - 2 VIEW

[Series 1: dg chest 2 view · 0.14mm/px · 3 of 3 slices shown]
[im 1/3]
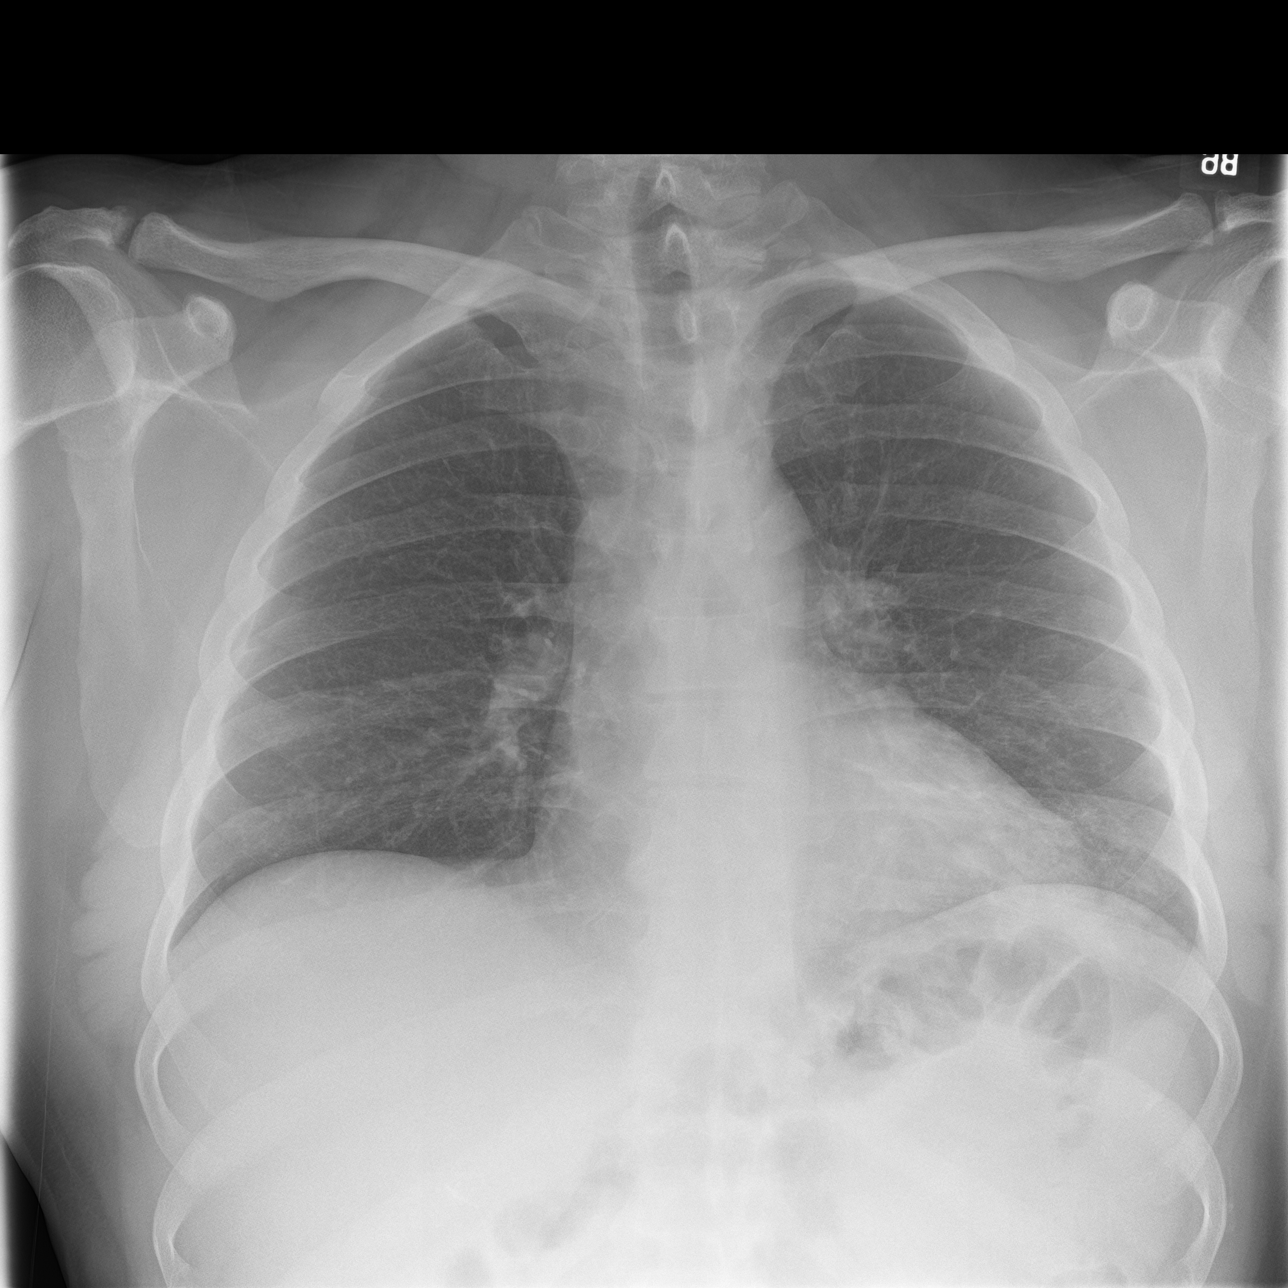
[im 2/3]
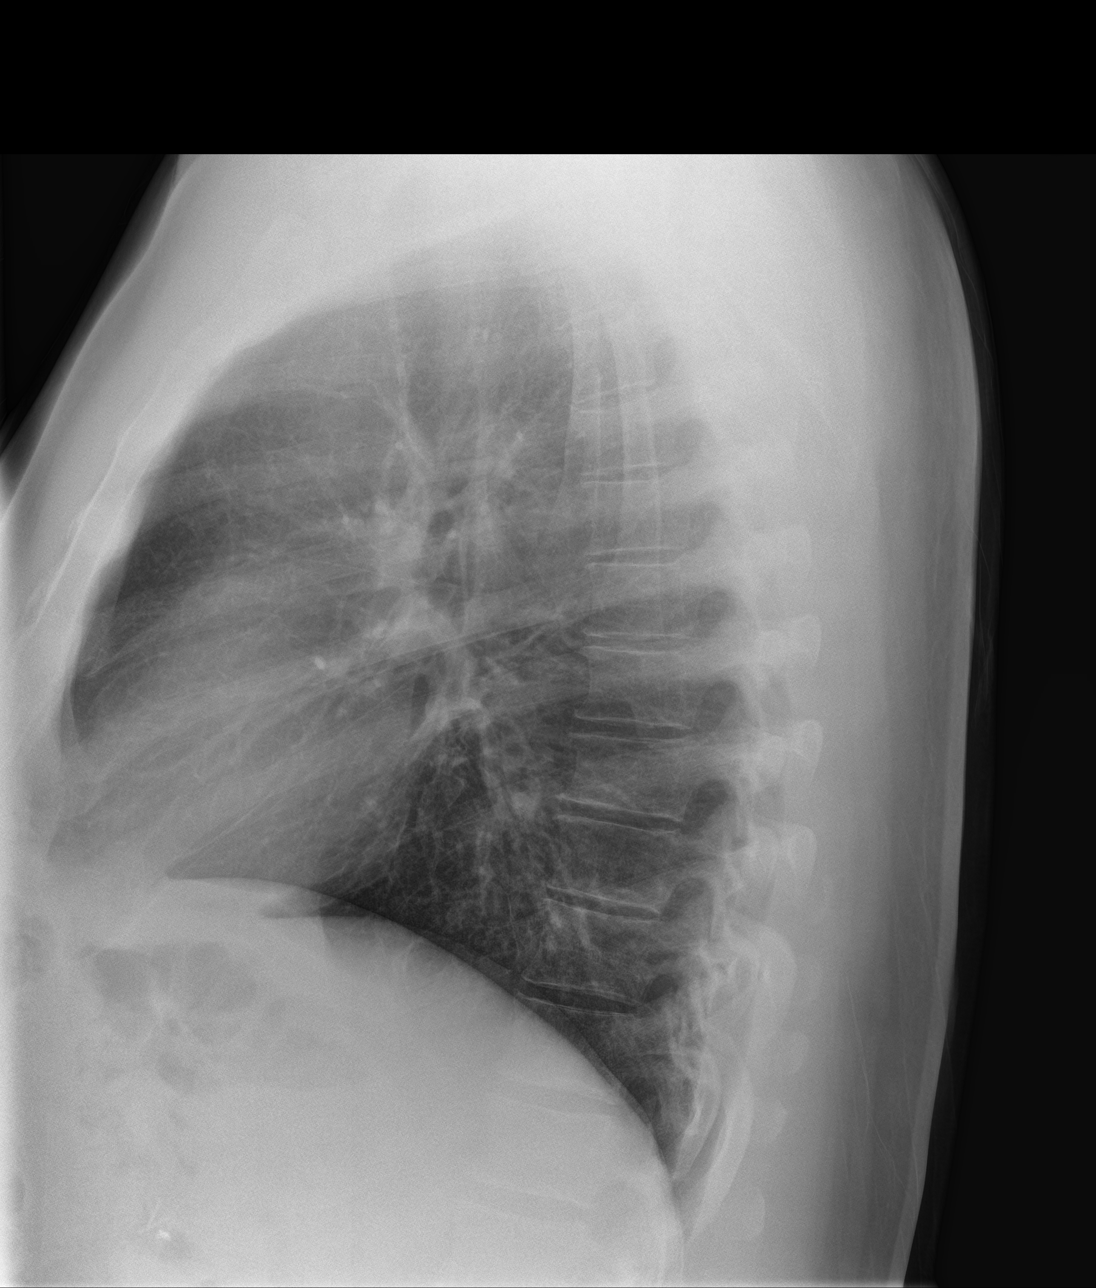
[im 3/3]
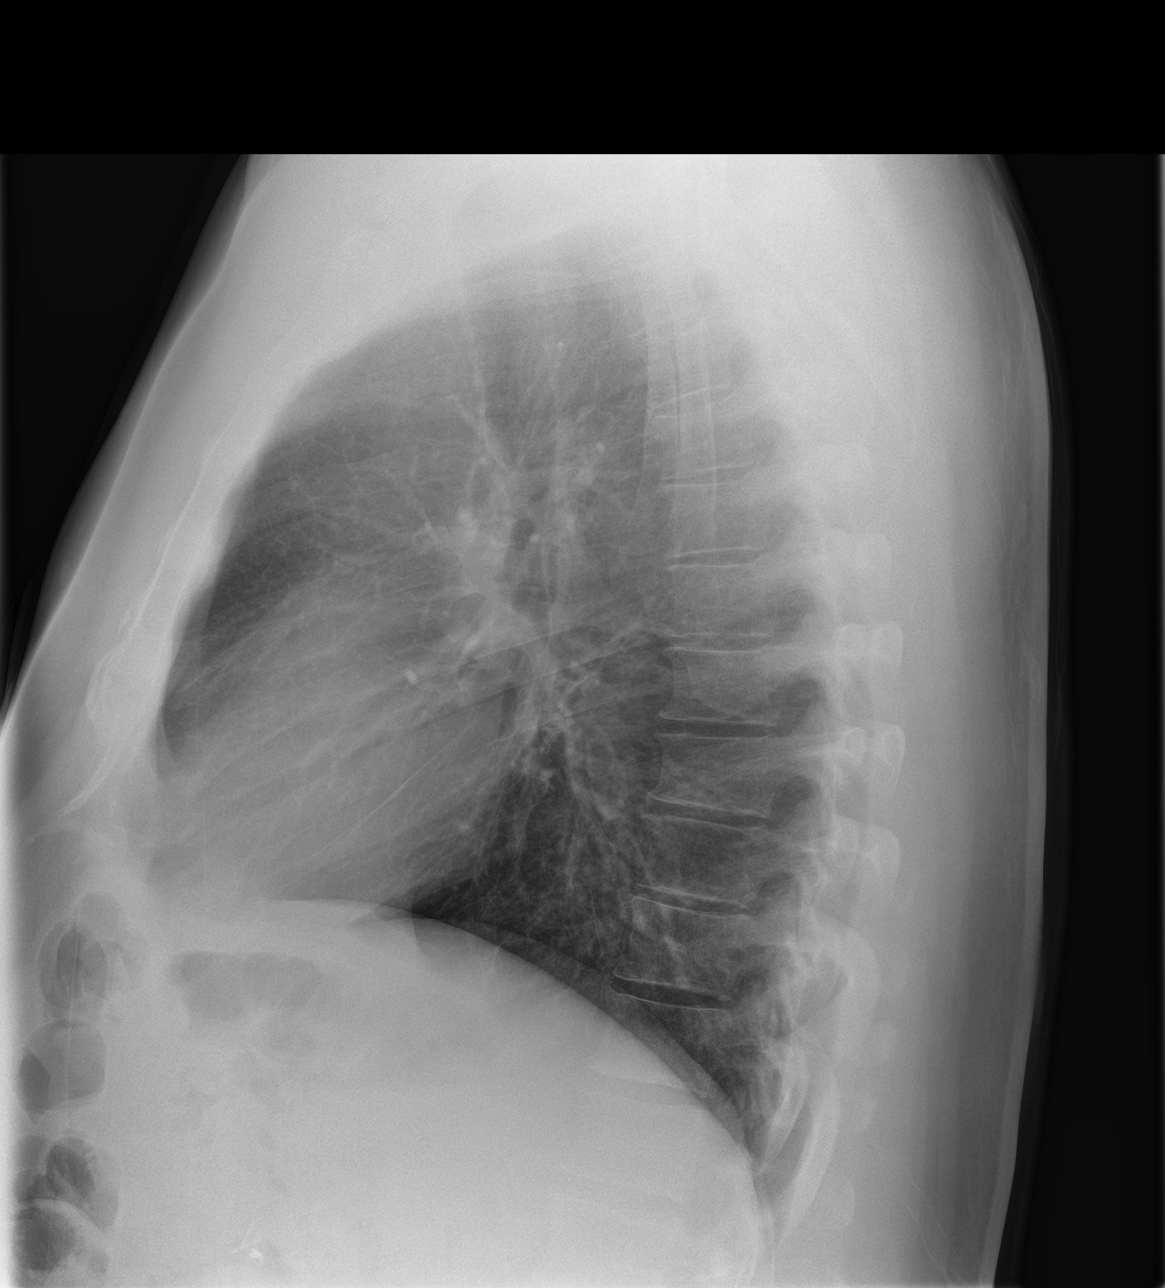

[3 of 3 positions shown; findings below may reference images not displayed]

FINDINGS: The heart size and mediastinal contours are within normal limits.
Both lungs are clear. The visualized skeletal structures are
unremarkable.
IMPRESSION: No active cardiopulmonary disease.

## 2018-11-09 ENCOUNTER — Ambulatory Visit: Payer: Self-pay | Admitting: Physician Assistant

## 2018-11-09 ENCOUNTER — Other Ambulatory Visit: Payer: Self-pay

## 2018-11-09 DIAGNOSIS — Z202 Contact with and (suspected) exposure to infections with a predominantly sexual mode of transmission: Secondary | ICD-10-CM

## 2018-11-09 DIAGNOSIS — Z113 Encounter for screening for infections with a predominantly sexual mode of transmission: Secondary | ICD-10-CM

## 2018-11-09 LAB — GRAM STAIN

## 2018-11-09 MED ORDER — METRONIDAZOLE 500 MG PO TABS: 2000.0000 mg | ORAL_TABLET | Freq: Once | ORAL | 0 refills | Status: AC

## 2018-11-09 NOTE — Progress Notes (Signed)
STI clinic/screening visit  Subjective:  Sergio Fleming is a 53 y.o. male being seen today for an STI screening visit. The patient reports they do not have symptoms.  Patient has the following medical conditions:   Patient Active Problem List   Diagnosis Date Noted  . Hyperglycemia 03/16/2017  . Obesity (BMI 30.0-34.9) 03/14/2017  . OSA (obstructive sleep apnea) 03/14/2017  . Forearm mass 09/21/2015  . Special screening for malignant neoplasms, colon   . Benign neoplasm of sigmoid colon   . Benign neoplasm of descending colon      Chief Complaint  Patient presents with  . SEXUALLY TRANSMITTED DISEASE    HPI  Patient reports that he is not having any symptoms and is a contact to [place].   See flowsheet for further details and programmatic requirements.    The following portions of the patient's history were reviewed and updated as appropriate: allergies, current medications, past medical history, past social history, past surgical history and problem list.  Objective:  There were no vitals filed for this visit.  Physical Exam Constitutional:      General: He is not in acute distress.    Appearance: Normal appearance. He is obese.  HENT:     Head: Normocephalic and atraumatic.     Mouth/Throat:     Mouth: Mucous membranes are moist.     Pharynx: Oropharynx is clear. No oropharyngeal exudate or posterior oropharyngeal erythema.  Eyes:     Conjunctiva/sclera: Conjunctivae normal.  Neck:     Musculoskeletal: Neck supple.  Pulmonary:     Effort: Pulmonary effort is normal.  Abdominal:     Palpations: Abdomen is soft. There is no mass.     Tenderness: There is no abdominal tenderness. There is no guarding or rebound.  Genitourinary:    Penis: Normal.      Comments: Pubic area without nits, lice, edema, erythema, lesions and inguinal adenopathy. Penis circumcised and without discharge at meatus. Lymphadenopathy:     Cervical: No cervical adenopathy.  Skin:  General: Skin is warm and dry.     Findings: No bruising, erythema, lesion or rash.  Neurological:     Mental Status: He is alert and oriented to person, place, and time.  Psychiatric:        Mood and Affect: Mood normal.        Behavior: Behavior normal.        Thought Content: Thought content normal.        Judgment: Judgment normal.       Assessment and Plan:  Sergio Fleming is a 53 y.o. male presenting to the [facility] Department for STI screening  1. Screening for STD (sexually transmitted disease) Patient is without symptoms today. Rec condoms with all sex. Await test results.  Counseled that RN will call if needs to RTC for further treatment once results are back.  - Gram stain - Gonococcus culture - HIV/HCV [facility] - Syphilis Serology, [facility]  2. Venereal disease contact Treat as a contact to [place] with Metronidazole 2 g po with food, no EtOH for 24 hr before and until 72 hr after taking medicine.  No sex for 7 days and until after partner completes treatment. RTC if vomits < 2 hr after taking medicine for re-treatment. - metroNIDAZOLE (FLAGYL) 500 MG tablet; Take 4 tablets (2,000 mg total) by mouth once for 1 dose.  Dispense: 4 tablet; Refill: 0     No follow-ups on file.  No future appointments.  Jerene Dilling, PA

## 2018-11-12 ENCOUNTER — Encounter: Payer: Self-pay | Admitting: Physician Assistant

## 2018-11-12 NOTE — Progress Notes (Signed)
Gram Stain results reviewed. Patient treated per provider orders. Hal Morales, RN

## 2018-11-14 LAB — GONOCOCCUS CULTURE

## 2018-11-16 LAB — HM HEPATITIS C SCREENING LAB: HM Hepatitis Screen: NEGATIVE

## 2018-11-16 LAB — HM HIV SCREENING LAB: HM HIV Screening: NEGATIVE

## 2018-12-17 ENCOUNTER — Other Ambulatory Visit: Payer: Self-pay

## 2018-12-17 ENCOUNTER — Encounter: Payer: Self-pay | Admitting: Family Medicine

## 2018-12-17 ENCOUNTER — Ambulatory Visit (INDEPENDENT_AMBULATORY_CARE_PROVIDER_SITE_OTHER): Payer: BLUE CROSS/BLUE SHIELD | Admitting: Family Medicine

## 2018-12-17 VITALS — BP 124/76 | HR 87 | Temp 97.9°F | Resp 18 | Ht 68.0 in | Wt 247.0 lb

## 2018-12-17 DIAGNOSIS — Z0000 Encounter for general adult medical examination without abnormal findings: Secondary | ICD-10-CM

## 2018-12-17 DIAGNOSIS — Z23 Encounter for immunization: Secondary | ICD-10-CM | POA: Diagnosis not present

## 2018-12-17 DIAGNOSIS — R0601 Orthopnea: Secondary | ICD-10-CM | POA: Diagnosis not present

## 2018-12-17 DIAGNOSIS — Z6837 Body mass index (BMI) 37.0-37.9, adult: Secondary | ICD-10-CM

## 2018-12-17 DIAGNOSIS — R739 Hyperglycemia, unspecified: Secondary | ICD-10-CM | POA: Diagnosis not present

## 2018-12-17 DIAGNOSIS — E66812 Obesity, class 2: Secondary | ICD-10-CM | POA: Insufficient documentation

## 2018-12-17 DIAGNOSIS — E782 Mixed hyperlipidemia: Secondary | ICD-10-CM

## 2018-12-17 DIAGNOSIS — G4733 Obstructive sleep apnea (adult) (pediatric): Secondary | ICD-10-CM

## 2018-12-17 DIAGNOSIS — Z125 Encounter for screening for malignant neoplasm of prostate: Secondary | ICD-10-CM

## 2018-12-17 NOTE — Patient Instructions (Signed)
Preventive Care 40-55 Years Old, Male Preventive care refers to lifestyle choices and visits with your health care provider that can promote health and wellness. This includes:  A yearly physical exam. This is also called an annual well check.  Regular dental and eye exams.  Immunizations.  Screening for certain conditions.  Healthy lifestyle choices, such as eating a healthy diet, getting regular exercise, not using drugs or products that contain nicotine and tobacco, and limiting alcohol use. What can I expect for my preventive care visit? Physical exam Your health care provider will check:  Height and weight. These may be used to calculate body mass index (BMI), which is a measurement that tells if you are at a healthy weight.  Heart rate and blood pressure.  Your skin for abnormal spots. Counseling Your health care provider may ask you questions about:  Alcohol, tobacco, and drug use.  Emotional well-being.  Home and relationship well-being.  Sexual activity.  Eating habits.  Work and work environment. What immunizations do I need?  Influenza (flu) vaccine  This is recommended every year. Tetanus, diphtheria, and pertussis (Tdap) vaccine  You may need a Td booster every 10 years. Varicella (chickenpox) vaccine  You may need this vaccine if you have not already been vaccinated. Zoster (shingles) vaccine  You may need this after age 55. Measles, mumps, and rubella (MMR) vaccine  You may need at least one dose of MMR if you were born in 1957 or later. You may also need a second dose. Pneumococcal conjugate (PCV13) vaccine  You may need this if you have certain conditions and were not previously vaccinated. Pneumococcal polysaccharide (PPSV23) vaccine  You may need one or two doses if you smoke cigarettes or if you have certain conditions. Meningococcal conjugate (MenACWY) vaccine  You may need this if you have certain conditions. Hepatitis A vaccine   You may need this if you have certain conditions or if you travel or work in places where you may be exposed to hepatitis A. Hepatitis B vaccine  You may need this if you have certain conditions or if you travel or work in places where you may be exposed to hepatitis B. Haemophilus influenzae type b (Hib) vaccine  You may need this if you have certain risk factors. Human papillomavirus (HPV) vaccine  If recommended by your health care provider, you may need three doses over 6 months. You may receive vaccines as individual doses or as more than one vaccine together in one shot (combination vaccines). Talk with your health care provider about the risks and benefits of combination vaccines. What tests do I need? Blood tests  Lipid and cholesterol levels. These may be checked every 5 years, or more frequently if you are over 55 years old.  Hepatitis C test.  Hepatitis B test. Screening  Lung cancer screening. You may have this screening every year starting at age 55 if you have a 30-pack-year history of smoking and currently smoke or have quit within the past 15 years.  Prostate cancer screening. Recommendations will vary depending on your family history and other risks.  Colorectal cancer screening. All adults should have this screening starting at age 55 and continuing until age 75. Your health care provider may recommend screening at age 45 if you are at increased risk. You will have tests every 1-10 years, depending on your results and the type of screening test.  Diabetes screening. This is done by checking your blood sugar (glucose) after you have not eaten   for a while (fasting). You may have this done every 1-3 years.  Sexually transmitted disease (STD) testing. Follow these instructions at home: Eating and drinking  Eat a diet that includes fresh fruits and vegetables, whole grains, lean protein, and low-fat dairy products.  Take vitamin and mineral supplements as recommended  by your health care provider.  Do not drink alcohol if your health care provider tells you not to drink.  If you drink alcohol: ? Limit how much you have to 0-2 drinks a day. ? Be aware of how much alcohol is in your drink. In the [place] one drink equals one 12 oz bottle of beer (355 mL), one 5 oz glass of wine (148 mL), or one 1 oz glass of hard liquor (44 mL). Lifestyle  Take daily care of your teeth and gums.  Stay active. Exercise for at least 30 minutes on 5 or more days each week.  Do not use any products that contain nicotine or tobacco, such as cigarettes, e-cigarettes, and chewing tobacco. If you need help quitting, ask your health care provider.  If you are sexually active, practice safe sex. Use a condom or other form of protection to prevent STIs (sexually transmitted infections).  Talk with your health care provider about taking a low-dose aspirin every day starting at age 19. What's next?  Go to your health care provider once a year for a well check visit.  Ask your health care provider how often you should have your eyes and teeth checked.  Stay up to date on all vaccines. This information is not intended to replace advice given to you by your health care provider. Make sure you discuss any questions you have with your health care provider. Document Released: 01/23/2015 Document Revised: 12/21/2017 Document Reviewed: 12/21/2017 Elsevier Patient Education  2020 [employer].

## 2018-12-17 NOTE — Progress Notes (Signed)
Name: Sergio Fleming   MRN: [id number]    DOB: 06-05-1965   Date:12/17/2018       Progress Note  Subjective  Chief Complaint  Chief Complaint  Patient presents with  . Annual Exam    HPI  Patient presents for annual CPE.  USPSTF grade A and B recommendations:  Diet: He is up 24lbs since last visit in March.  States he has been overeating and eating out a lot. Exercise: Gets several miles of walking at work.  Nothing outside of work.  Depression: phq 9 is positive.  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  Depression screen [facility] 2/9 12/17/2018 03/14/2017  Decreased Interest 0 0  Down, Depressed, Hopeless 0 0  PHQ - 2 Score 0 0  Altered sleeping 0 -  Tired, decreased energy 0 -  Change in appetite 0 -  Feeling bad or failure about yourself  0 -  Trouble concentrating 0 -  Moving slowly or fidgety/restless 0 -  Suicidal thoughts 0 -  PHQ-9 Score 0 -  Difficult doing work/chores Not difficult at all -   Hypertension:  BP Readings from Last 3 Encounters:  12/17/18 124/76  04/05/17 110/68  03/14/17 124/70   Obesity: Wt Readings from Last 3 Encounters:  12/17/18 247 lb (112 kg)  04/05/17 222 lb 8 oz (100.9 kg)  03/14/17 224 lb 3.2 oz (101.7 kg)   BMI Readings from Last 3 Encounters:  12/17/18 37.56 kg/m  04/05/17 33.83 kg/m  03/14/17 34.09 kg/m    Lipids:  Lab Results  Component Value Date   CHOL 276 (H) 03/14/2017   Lab Results  Component Value Date   HDL 48 03/14/2017   Lab Results  Component Value Date   LDLCALC 189 (H) 03/14/2017   Lab Results  Component Value Date   TRIG 201 (H) 03/14/2017   Lab Results  Component Value Date   CHOLHDL 5.8 (H) 03/14/2017  - He is not willing to be on long-term medication for cholesterol at this time. The 10-year ASCVD risk score Mikey Bussing DC Brooke Bonito., et al., 2013) is: 7.9%   Values used to calculate the score:     Age: 53 years     Sex: Male     Is Non-Hispanic African American:  No     Diabetic: No     Tobacco smoker: No     Systolic Blood Pressure: [id number] mmHg     Is BP treated: No     HDL Cholesterol: 48 mg/dL     Total Cholesterol: 276 mg/dL  No results found for: LDLDIRECT Glucose:  Glucose, Bld  Date Value Ref Range Status  03/14/2017 116 (H) 65 - 99 mg/dL Final    Comment:    .            Fasting reference interval . For someone without known diabetes, a glucose value between 100 and 125 mg/dL is consistent with prediabetes and should be confirmed with a follow-up test. .       Office Visit from 12/17/2018 in [facility]  AUDIT-C Score  1    Minimal - 1-2 drinks a week.  Body mass index is 37.56 kg/m.  Married STD testing and prevention (HIV/chl/gon/syphilis): Had negative testing done in October 2020 which was negative, no new partners since then Hep C: Hep C negative October 2020.  Skin cancer: No concerning lesions Colorectal cancer: Denies family or personal history of colorectal cancer, no changes  in BM's - no blood in stool, dark and tarry stool, mucus in stool, or constipation/diarrhea. Prostate cancer: No family history of prostate cancer. No results found for: PSA  IPSS Questionnaire (AUA-7): Over the past month.   1)  How often have you had a sensation of not emptying your bladder completely after you finish urinating?  0 - Not at all  2)  How often have you had to urinate again less than two hours after you finished urinating? 1 - Less than 1 time in 5  3)  How often have you found you stopped and started again several times when you urinated?  0 - Not at all  4) How difficult have you found it to postpone urination?  0 - Not at all  5) How often have you had a weak urinary stream?  0 - Not at all  6) How often have you had to push or strain to begin urination?  0 - Not at all  7) How many times did you most typically get up to urinate from the time you went to bed until the time you got up in the morning?  1  - 1 time  Total score:  0-7 mildly symptomatic   8-19 moderately symptomatic   20-35 severely symptomatic  Score of 2.   Lung cancer:  Never smoker Low Dose CT Chest recommended if Age 16-80 years, 30 pack-year currently smoking OR have quit w/in 15years. Patient does not qualify.   AAA: The USPSTF recommends one-time screening with ultrasonography in men ages 9 to 33 years who have ever smoked ECG:  No chest pain, palpitations. Endorses orthopnea - see ROS for details.  Advanced Care Planning: A voluntary discussion about advance care planning including the explanation and discussion of advance directives.  Discussed health care proxy and Living will, and the patient was able to identify a health care proxy as Wife Demetrion Blunk).  Patient does not have a living will at present time. If patient does have living will, I have requested they bring this to the clinic to be scanned in to their chart.  Patient Active Problem List   Diagnosis Date Noted  . Class 2 severe obesity due to excess calories with serious comorbidity and body mass index (BMI) of 37.0 to 37.9 in adult [facility]) 12/17/2018  . Mixed hyperlipidemia 12/17/2018  . Hyperglycemia 03/16/2017  . Obesity (BMI 30.0-34.9) 03/14/2017  . OSA (obstructive sleep apnea) 03/14/2017  . Forearm mass 09/21/2015  . Special screening for malignant neoplasms, colon   . Benign neoplasm of sigmoid colon   . Benign neoplasm of descending colon     Past Surgical History:  Procedure Laterality Date  . CHOLECYSTECTOMY    . COLONOSCOPY WITH PROPOFOL N/A 03/19/2015   Procedure: COLONOSCOPY WITH PROPOFOL;  Surgeon: Lucilla Lame, MD;  Location: [place];  Service: Endoscopy;  Laterality: N/A;  . POLYPECTOMY  03/19/2015   Procedure: POLYPECTOMY;  Surgeon: Lucilla Lame, MD;  Location: [place];  Service: Endoscopy;;    Family History  Problem Relation Age of Onset  . Ovarian cancer Mother   . Pancreatic cancer Father   . Other Sister         Non-cancerous brain tumor    Social History   Socioeconomic History  . Marital status: Married    Spouse name: Barnett Applebaum  . Number of children: 2  . Years of education: Not on file  . Highest education level: Not on file  Occupational History  .  Not on file  Social Needs  . Financial resource strain: Not hard at all  . Food insecurity    Worry: Never true    Inability: Never true  . Transportation needs    Medical: No    Non-medical: No  Tobacco Use  . Smoking status: Never Smoker  . Smokeless tobacco: Never Used  Substance and Sexual Activity  . Alcohol use: No    Comment: rare  . Drug use: No  . Sexual activity: Never    Partners: Female  Lifestyle  . Physical activity    Days per week: 7 days    Minutes per session: 60 min  . Stress: Not at all  Relationships  . Social connections    Talks on phone: More than three times a week    Gets together: Never    Attends religious service: More than 4 times per year    Active member of club or organization: Yes    Attends meetings of clubs or organizations: More than 4 times per year    Relationship status: Married  . Intimate partner violence    Fear of current or ex partner: No    Emotionally abused: No    Physically abused: No    Forced sexual activity: No  Other Topics Concern  . 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    No current outpatient medications on file.  No Known Allergies   ROS  Constitutional: Negative for fever or weight change.  Respiratory: Negative for cough; endorses occasional shortness of breath - states feels like he is not getting deep enough breaths at times - worse at night.  Has history of OSA - does not use his machine.  We will check labs including BNP today and EKG Cardiovascular: Negative for chest pain or palpitations.  Gastrointestinal: Negative for abdominal pain, no bowel changes.   Musculoskeletal: Negative for gait problem or joint swelling.  Skin: Negative for rash.  Neurological: Negative for dizziness or headache.  No other specific complaints in a complete review of systems (except as listed in HPI above).  Objective  Vitals:   12/17/18 1303  BP: 124/76  Pulse: 87  Resp: 18  Temp: 97.9 F (36.6 C)  TempSrc: Temporal  SpO2: 98%  Weight: 247 lb (112 kg)  Height: 5\' 8"  (1.727 m)    Body mass index is 37.56 kg/m.  Physical Exam  Constitutional: Patient appears well-developed and well-nourished. No distress.  HENT: Head: Normocephalic and atraumatic. Ears: B TMs ok, no erythema or effusion; Nose: Nose normal. Mouth/Throat: Oropharynx is clear and moist. No oropharyngeal exudate.  Eyes: Conjunctivae and EOM are normal. Pupils are equal, round, and reactive to light. No scleral icterus.  Neck: Normal range of motion. Neck supple. No JVD present. No thyromegaly present.  Cardiovascular: Normal rate, regular rhythm and normal heart sounds.  No murmur heard. No BLE edema. Pulmonary/Chest: Effort normal and breath sounds normal. No respiratory distress. MALE GENITALIA: Deferred RECTAL: Deferred Musculoskeletal: Normal range of motion, no joint effusions. No gross deformities Neurological: he is alert and oriented to person, place, and time. No cranial nerve deficit. Coordination, balance, strength, speech and gait are normal.  Skin: Skin is warm and dry. No rash noted. No erythema.  Psychiatric: Patient has a normal mood and affect. behavior is normal. Judgment and thought content normal.  Recent Results (from the past 2160 hour(s))  Gonococcus culture     Status: None   Collection Time: 11/09/18 10:00 AM   Specimen: Urethra; Genital   UE  Result Value Ref Range   GC Culture Only Final report    Result 1 Comment     Comment: No Neisseria gonorrhoeae isolated.  Gram stain     Status: None   Collection Time: 11/09/18 10:35 AM  Result Value Ref Range    Gram Stain Result Comment:     Comment: <2WBC/HPF;INTRACELLULAR GND ABSENT  HM HIV SCREENING LAB     Status: None   Collection Time: 11/16/18 12:00 AM  Result Value Ref Range   HM HIV Screening Negative - Validated   HM HEPATITIS C SCREENING LAB     Status: None   Collection Time: 11/16/18 12:00 AM  Result Value Ref Range   HM Hepatitis Screen Negative-Validated      PHQ2/9: Depression screen [facility] 2/9 12/17/2018 03/14/2017  Decreased Interest 0 0  Down, Depressed, Hopeless 0 0  PHQ - 2 Score 0 0  Altered sleeping 0 -  Tired, decreased energy 0 -  Change in appetite 0 -  Feeling bad or failure about yourself  0 -  Trouble concentrating 0 -  Moving slowly or fidgety/restless 0 -  Suicidal thoughts 0 -  PHQ-9 Score 0 -  Difficult doing work/chores Not difficult at all -   Fall Risk: Fall Risk  12/17/2018 03/14/2017  Falls in the past year? 0 No  Number falls in past yr: 0 -  Injury with Fall? 0 -  Follow up Falls evaluation completed -     Assessment & Plan  1. Annual physical exam -Prostate cancer screening and PSA options (with potential risks and benefits of testing vs not testing) were discussed along with recent recs/guidelines. -USPSTF grade A and B recommendations reviewed with patient; age-appropriate recommendations, preventive care, screening tests, etc discussed and encouraged; healthy living encouraged; see AVS for patient education given to patient -Discussed importance of 150 minutes of physical activity weekly, eat two servings of fish weekly, eat one serving of tree nuts ( cashews, pistachios, pecans, almonds.Marland Kitchen) every other day, eat 6 servings of fruit/vegetables daily and drink plenty of water and avoid sweet beverages.  - COMPLETE METABOLIC PANEL WITH GFR - TSH - Lipid panel - Hemoglobin A1c - EKG 12-Lead  2. Need for influenza vaccination - Flu Vaccine QUAD 6+ mos PF IM (Fluarix Quad PF)  3. Hyperglycemia - COMPLETE METABOLIC PANEL WITH GFR -  Hemoglobin A1c  4. Mixed hyperlipidemia - Lipid panel - EKG 12-Lead  5. Class 2 severe obesity due to excess calories with serious comorbidity and body mass index (BMI) of 37.0 to 37.9 in adult [facility]) - See above regarding teaching  6. Prostate cancer screening - PSA  7. OSA (obstructive sleep apnea) - Ambulatory referral to Pulmonology  8. Orthopnea - COMPLETE METABOLIC PANEL WITH GFR - Lipid panel - Brain natriuretic peptide - EKG 12-Lead - Ambulatory referral to Pulmonology

## 2018-12-18 ENCOUNTER — Other Ambulatory Visit: Payer: Self-pay | Admitting: Family Medicine

## 2018-12-18 DIAGNOSIS — E1165 Type 2 diabetes mellitus with hyperglycemia: Secondary | ICD-10-CM | POA: Insufficient documentation

## 2018-12-18 DIAGNOSIS — E1169 Type 2 diabetes mellitus with other specified complication: Secondary | ICD-10-CM | POA: Insufficient documentation

## 2018-12-18 DIAGNOSIS — E785 Hyperlipidemia, unspecified: Secondary | ICD-10-CM | POA: Insufficient documentation

## 2018-12-18 LAB — COMPLETE METABOLIC PANEL WITH GFR
AG Ratio: 1.6 (calc) (ref 1.0–2.5)
ALT: 39 U/L (ref 9–46)
AST: 20 U/L (ref 10–35)
Albumin: 4.4 g/dL (ref 3.6–5.1)
Alkaline phosphatase (APISO): 62 U/L (ref 35–144)
BUN: 15 mg/dL (ref 7–25)
CO2: 30 mmol/L (ref 20–32)
Calcium: 9.4 mg/dL (ref 8.6–10.3)
Chloride: 104 mmol/L (ref 98–110)
Creat: 0.95 mg/dL (ref 0.70–1.33)
GFR, Est African American: 104 mL/min/{1.73_m2} (ref >=60)
GFR, Est Non African American: 90 mL/min/{1.73_m2} (ref >=60)
Globulin: 2.8 g/dL (calc) (ref 1.9–3.7)
Glucose, Bld: 193 mg/dL — ABNORMAL HIGH (ref 65–99)
Potassium: 4.4 mmol/L (ref 3.5–5.3)
Sodium: 141 mmol/L (ref 135–146)
Total Bilirubin: 0.5 mg/dL (ref 0.2–1.2)
Total Protein: 7.2 g/dL (ref 6.1–8.1)

## 2018-12-18 LAB — LIPID PANEL
Cholesterol: 287 mg/dL — ABNORMAL HIGH (ref <200)
HDL: 46 mg/dL (ref >=40)
LDL Cholesterol (Calc): 186 mg/dL (calc) — ABNORMAL HIGH
Non-HDL Cholesterol (Calc): 241 mg/dL (calc) — ABNORMAL HIGH (ref <130)
Total CHOL/HDL Ratio: 6.2 (calc) — ABNORMAL HIGH (ref <5.0)
Triglycerides: 324 mg/dL — ABNORMAL HIGH (ref <150)

## 2018-12-18 LAB — TSH: TSH: 2.51 mIU/L (ref 0.40–4.50)

## 2018-12-18 LAB — HEMOGLOBIN A1C
Hgb A1c MFr Bld: 7.3 % of total Hgb — ABNORMAL HIGH (ref <5.7)
Mean Plasma Glucose: 163 (calc)
eAG (mmol/L): 9 (calc)

## 2018-12-18 LAB — BRAIN NATRIURETIC PEPTIDE: Brain Natriuretic Peptide: 18 pg/mL (ref <100)

## 2018-12-18 LAB — PSA: PSA: 0.8 ng/mL (ref <=4.0)

## 2019-01-08 ENCOUNTER — Encounter: Payer: Self-pay | Admitting: Family Medicine

## 2019-01-08 ENCOUNTER — Ambulatory Visit (INDEPENDENT_AMBULATORY_CARE_PROVIDER_SITE_OTHER): Payer: BLUE CROSS/BLUE SHIELD | Admitting: Family Medicine

## 2019-01-08 ENCOUNTER — Other Ambulatory Visit: Payer: Self-pay

## 2019-01-08 VITALS — BP 118/74 | HR 90 | Temp 97.1°F | Resp 18 | Ht 68.0 in | Wt 243.8 lb

## 2019-01-08 DIAGNOSIS — E1165 Type 2 diabetes mellitus with hyperglycemia: Secondary | ICD-10-CM

## 2019-01-08 DIAGNOSIS — E66812 Obesity, class 2: Secondary | ICD-10-CM

## 2019-01-08 DIAGNOSIS — E1169 Type 2 diabetes mellitus with other specified complication: Secondary | ICD-10-CM

## 2019-01-08 DIAGNOSIS — E785 Hyperlipidemia, unspecified: Secondary | ICD-10-CM

## 2019-01-08 DIAGNOSIS — Z6837 Body mass index (BMI) 37.0-37.9, adult: Secondary | ICD-10-CM

## 2019-01-08 DIAGNOSIS — Z23 Encounter for immunization: Secondary | ICD-10-CM | POA: Diagnosis not present

## 2019-01-08 NOTE — Progress Notes (Signed)
Name: Sergio Fleming   MRN: [id number]    DOB: 1965-01-18   Date:01/08/2019       Progress Note  Subjective  Chief Complaint  Chief Complaint  Patient presents with  . Follow-up    HPI  PT presents for DM follow up - newly diagnosed 12/17/2018 at CPE.  He has been working hard on dietary changes and has been much more active lately to work on things without medications.  He is down 5lbs since our last visit.   Diabetes mellitus type 2 Checking sugars?  no How often? N/A Range (low to high) over last two weeks:  N/A Does patient feel additional teaching/training would be helpful?  no  Have they attended Diabetes education classes? no  Trying to limit white bread, white rice, white potatoes, sweets?  yes; stopped snacking at night; eating a lot more fresh vegetables - eating a lot more salads.   Trying to limit sweetened drinks like iced tea, soft drinks, sports drinks, fruit juices?  yes Checking feet every day/night?  yes Last eye exam:  Explained annual eye exam - will schedule. Denies: Polyuria, polydipsia, polyphagia, vision changes, or neuropathy.  Most recent A1C:  Lab Results  Component Value Date   HGBA1C 7.3 (H) 12/17/2018    We will recheck today. Last CMP Results : is due for repeat today    Component Value Date/Time   NA 141 12/17/2018 0000   K 4.4 12/17/2018 0000   CL 104 12/17/2018 0000   CO2 30 12/17/2018 0000   GLUCOSE 193 (H) 12/17/2018 0000   BUN 15 12/17/2018 0000   CREATININE 0.95 12/17/2018 0000   CALCIUM 9.4 12/17/2018 0000   PROT 7.2 12/17/2018 0000   AST 20 12/17/2018 0000   ALT 39 12/17/2018 0000   BILITOT 0.5 12/17/2018 0000   GFRNONAA 90 12/17/2018 0000   GFRAA 104 12/17/2018 0000   Urine Micro UTD? No Current Medication Management: Diabetic Medications:  ACEI/ARB: No Statin: Patient refuses Aspirin therapy: No  HLD: He does not want to take statin therapy at this time - wants to make lifestyle changes first, then recheck at follow up  to see if still needing statin therapy will accept at that time.  Discused ASCVD risk.   The 10-year ASCVD risk score Mikey Bussing DC Brooke Bonito., et al., 2013) is: 14.7%   Values used to calculate the score:     Age: 53 years     Sex: Male     Is Non-Hispanic African American: No     Diabetic: Yes     Tobacco smoker: No     Systolic Blood Pressure: 123456 mmHg     Is BP treated: No     HDL Cholesterol: 46 mg/dL     Total Cholesterol: 287 mg/dL   Patient Active Problem List   Diagnosis Date Noted  . Uncontrolled type 2 diabetes mellitus with hyperglycemia ([place]) 12/18/2018  . Hyperlipidemia associated with type 2 diabetes mellitus ([place]) 12/18/2018  . Class 2 severe obesity due to excess calories with serious comorbidity and body mass index (BMI) of 37.0 to 37.9 in adult [facility]) 12/17/2018  . Mixed hyperlipidemia 12/17/2018  . Hyperglycemia 03/16/2017  . OSA (obstructive sleep apnea) 03/14/2017  . Forearm mass 09/21/2015  . Special screening for malignant neoplasms, colon   . Benign neoplasm of sigmoid colon   . Benign neoplasm of descending colon     Past Surgical History:  Procedure Laterality Date  . CHOLECYSTECTOMY    . COLONOSCOPY  WITH PROPOFOL N/A 03/19/2015   Procedure: COLONOSCOPY WITH PROPOFOL;  Surgeon: Lucilla Lame, MD;  Location: [place];  Service: Endoscopy;  Laterality: N/A;  . POLYPECTOMY  03/19/2015   Procedure: POLYPECTOMY;  Surgeon: Lucilla Lame, MD;  Location: [place];  Service: Endoscopy;;    Family History  Problem Relation Age of Onset  . Ovarian cancer Mother   . Pancreatic cancer Father   . Other Sister        Non-cancerous brain tumor    Social History   Socioeconomic History  . Marital status: Married    Spouse name: Barnett Applebaum  . Number of children: 2  . Years of education: Not on file  . Highest education level: Not on file  Occupational History  . Not on file  Tobacco Use  . Smoking status: Never Smoker  . Smokeless tobacco: Never Used   Substance and Sexual Activity  . Alcohol use: No    Comment: rare  . Drug use: No  . Sexual activity: Never    Partners: Female  Other Topics Concern  . 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   Social Determinants of Health   Financial Resource Strain:   . Difficulty of Paying Living Expenses: Not on file  Food Insecurity:   . Worried About Charity fundraiser in the Last Year: Not on file  . Ran Out of Food in the Last Year: Not on file  Transportation Needs:   . Lack of Transportation (Medical): Not on file  . Lack of Transportation (Non-Medical): Not on file  Physical Activity:   . Days of Exercise per Week: Not on file  . Minutes of Exercise per Session: Not on file  Stress:   . Feeling of Stress : Not on file  Social Connections: Unknown  . Frequency of Communication with Friends and Family: More than three times a week  . Frequency of Social Gatherings with Friends and Family: Never  . Attends Religious Services: Not on file  . Active Member of Clubs or Organizations: Not on file  . Attends Archivist Meetings: Not on file  . Marital Status: Not on file  Intimate Partner Violence:   . Fear of Current or Ex-Partner: Not on file  . Emotionally Abused: Not on file  . Physically Abused: Not on file  . Sexually Abused: Not on file    No current outpatient medications on file.  No Known Allergies  I personally reviewed active problem list, medication list, allergies, health maintenance, notes from last encounter, lab results with the patient/caregiver today.   ROS  Ten systems reviewed and is negative except as mentioned in HPI  Objective  Vitals:   01/08/19 1053  BP: 118/74  Pulse: 90  Resp: 18  Temp: (!) 97.1 F (36.2 C)  TempSrc: Temporal  SpO2: 95%  Weight: 243 lb 12.8 oz (110.6 kg)  Height: 5\' 8"  (1.727 m)   Body mass index is 37.07 kg/m.  Physical Exam   Constitutional: Patient appears well-developed and well-nourished. No distress.  HENT: Head: Normocephalic and atraumatic.  Neck: Normal range of motion. Neck supple. No JVD present.  Cardiovascular: Normal rate, regular rhythm and normal heart sounds.  No murmur heard. No BLE edema. Pulmonary/Chest: Effort normal and breath sounds normal. No respiratory distress. Musculoskeletal: Normal range of motion, no joint effusions. No gross deformities Neurological: Pt is alert  and oriented to person, place, and time. No cranial nerve deficit. Coordination, balance, strength, speech and gait are normal.  Skin: Skin is warm and dry. No rash noted. No erythema.  Psychiatric: Patient has a normal mood and affect. behavior is normal. Judgment and thought content normal.   No results found for this or any previous visit (from the past 72 hour(s)).  Diabetic Foot Exam: Diabetic Foot Exam - Simple   No data filed      PHQ2/9: Depression screen [facility] 2/9 01/08/2019 12/17/2018 03/14/2017  Decreased Interest 0 0 0  Down, Depressed, Hopeless 0 0 0  PHQ - 2 Score 0 0 0  Altered sleeping 0 0 -  Tired, decreased energy 0 0 -  Change in appetite 0 0 -  Feeling bad or failure about yourself  0 0 -  Trouble concentrating 0 0 -  Moving slowly or fidgety/restless 0 0 -  Suicidal thoughts 0 0 -  PHQ-9 Score 0 0 -  Difficult doing work/chores Not difficult at all Not difficult at all -   PHQ-2/9 Result is negative.    Fall Risk: Fall Risk  01/08/2019 12/17/2018 03/14/2017  Falls in the past year? 0 0 No  Number falls in past yr: 0 0 -  Injury with Fall? 0 0 -  Follow up Falls evaluation completed Falls evaluation completed -   Assessment & Plan  1. Uncontrolled type 2 diabetes mellitus with hyperglycemia ([place]) - Does not want to take medication, does not want to follow up in 3 months - strongly voices opinion to follow up in 6 months to allow time to make intensive lifestyle management changes.   We will  check urine micro today.  He will obtain eye exam. Education provided on disease process, nutrition, and exercise.  He is very committed to lifestyle modification strategies. - Microalbumin / creatinine urine ratio  2. Hyperlipidemia associated with type 2 diabetes mellitus ([place]) - Strongly declines medication despite my education on increased risk with new diabetes diagnosis, goal of LDL <70 - wants to make lifestyle changes first.  3. Class 2 severe obesity due to excess calories with serious comorbidity and body mass index (BMI) of 37.0 to 37.9 in adult [facility]) - Discussed importance of 150 minutes of physical activity weekly, eat two servings of fish weekly, eat one serving of tree nuts ( cashews, pistachios, pecans, almonds.Marland Kitchen) every other day, eat 6 servings of fruit/vegetables daily and drink plenty of water and avoid sweet beverages.   4. Need for Tdap vaccination - Tdap vaccine greater than or equal to 7yo IM  Face-to-face time with patient was more than 25 minutes, >50% time spent counseling and coordination of care

## 2019-01-08 NOTE — Patient Instructions (Addendum)
Schedule Eye exam - make sure they know that you have been diagnosed with diabetes.    Fat and Cholesterol Restricted Eating Plan Getting too much fat and cholesterol in your diet may cause health problems. Choosing the right foods helps keep your fat and cholesterol at normal levels. This can keep you from getting certain diseases. Your doctor may recommend an eating plan that includes:  Total fat: ______% or less of total calories a day.  Saturated fat: ______% or less of total calories a day.  Cholesterol: less than _________mg a day.  Fiber: ______g a day. What are tips for following this plan? Meal planning  At meals, divide your plate into four equal parts: ? Fill one-half of your plate with vegetables and green salads. ? Fill one-fourth of your plate with whole grains. ? Fill one-fourth of your plate with low-fat (lean) protein foods.  Eat fish that is high in omega-3 fats at least two times a week. This includes mackerel, tuna, sardines, and salmon.  Eat foods that are high in fiber, such as whole grains, beans, apples, broccoli, carrots, peas, and barley. General tips   Work with your doctor to lose weight if you need to.  Avoid: ? Foods with added sugar. ? Fried foods. ? Foods with partially hydrogenated oils.  Limit alcohol intake to no more than 1 drink a day for nonpregnant women and 2 drinks a day for men. One drink equals 12 oz of beer, 5 oz of wine, or 1 oz of hard liquor. Reading food labels  Check food labels for: ? Trans fats. ? Partially hydrogenated oils. ? Saturated fat (g) in each serving. ? Cholesterol (mg) in each serving. ? Fiber (g) in each serving.  Choose foods with healthy fats, such as: ? Monounsaturated fats. ? Polyunsaturated fats. ? Omega-3 fats.  Choose grain products that have whole grains. Look for the word "whole" as the first word in the ingredient list. Cooking  Cook foods using low-fat methods. These include baking,  boiling, grilling, and broiling.  Eat more home-cooked foods. Eat at restaurants and buffets less often.  Avoid cooking using saturated fats, such as butter, cream, palm oil, palm kernel oil, and coconut oil. Recommended foods  Fruits  All fresh, canned (in natural juice), or frozen fruits. Vegetables  Fresh or frozen vegetables (raw, steamed, roasted, or grilled). Green salads. Grains  Whole grains, such as whole wheat or whole grain breads, crackers, cereals, and pasta. Unsweetened oatmeal, bulgur, barley, quinoa, or brown rice. Corn or whole wheat flour tortillas. Meats and other protein foods  Ground beef (85% or leaner), grass-fed beef, or beef trimmed of fat. Skinless chicken or [place]. Ground chicken or [place]. Pork trimmed of fat. All fish and seafood. Egg whites. Dried beans, peas, or lentils. Unsalted nuts or seeds. Unsalted canned beans. Nut butters without added sugar or oil. Dairy  Low-fat or nonfat dairy products, such as skim or 1% milk, 2% or reduced-fat cheeses, low-fat and fat-free ricotta or cottage cheese, or plain low-fat and nonfat yogurt. Fats and oils  Tub margarine without trans fats. Light or reduced-fat mayonnaise and salad dressings. Avocado. Olive, canola, sesame, or safflower oils. The items listed above may not be a complete list of foods and beverages you can eat. Contact a dietitian for more information. Foods to avoid Fruits  Canned fruit in heavy syrup. Fruit in cream or butter sauce. Fried fruit. Vegetables  Vegetables cooked in cheese, cream, or butter sauce. Fried vegetables. Grains  White  bread. White pasta. White rice. Cornbread. Bagels, pastries, and croissants. Crackers and snack foods that contain trans fat and hydrogenated oils. Meats and other protein foods  Fatty cuts of meat. Ribs, chicken wings, bacon, sausage, bologna, salami, chitterlings, fatback, hot dogs, bratwurst, and packaged lunch meats. Liver and organ meats. Whole eggs  and egg yolks. Chicken and [place] with skin. Fried meat. Dairy  Whole or 2% milk, cream, half-and-half, and cream cheese. Whole milk cheeses. Whole-fat or sweetened yogurt. Full-fat cheeses. Nondairy creamers and whipped toppings. Processed cheese, cheese spreads, and cheese curds. Beverages  Alcohol. Sugar-sweetened drinks such as sodas, lemonade, and fruit drinks. Fats and oils  Butter, stick margarine, lard, shortening, ghee, or bacon fat. Coconut, palm kernel, and palm oils. Sweets and desserts  Corn syrup, sugars, honey, and molasses. Candy. Jam and jelly. Syrup. Sweetened cereals. Cookies, pies, cakes, donuts, muffins, and ice cream. The items listed above may not be a complete list of foods and beverages you should avoid. Contact a dietitian for more information. Summary  Choosing the right foods helps keep your fat and cholesterol at normal levels. This can keep you from getting certain diseases.  At meals, fill one-half of your plate with vegetables and green salads.  Eat high-fiber foods, like whole grains, beans, apples, carrots, peas, and barley.  Limit added sugar, saturated fats, alcohol, and fried foods. This information is not intended to replace advice given to you by your health care provider. Make sure you discuss any questions you have with your health care provider. Document Released: 06/28/2011 Document Revised: 08/30/2017 Document Reviewed: 09/13/2016 Elsevier Patient Education  [place].   Diabetes Mellitus and Nutrition, Adult When you have diabetes (diabetes mellitus), it is very important to have healthy eating habits because your blood sugar (glucose) levels are greatly affected by what you eat and drink. Eating healthy foods in the appropriate amounts, at about the same times every day, can help you:  Control your blood glucose.  Lower your risk of heart disease.  Improve your blood pressure.  Reach or maintain a healthy weight. Every  person with diabetes is different, and each person has different needs for a meal plan. Your health care provider may recommend that you work with a diet and nutrition specialist (dietitian) to make a meal plan that is best for you. Your meal plan may vary depending on factors such as:  The calories you need.  The medicines you take.  Your weight.  Your blood glucose, blood pressure, and cholesterol levels.  Your activity level.  Other health conditions you have, such as heart or kidney disease. How do carbohydrates affect me? Carbohydrates, also called carbs, affect your blood glucose level more than any other type of food. Eating carbs naturally raises the amount of glucose in your blood. Carb counting is a method for keeping track of how many carbs you eat. Counting carbs is important to keep your blood glucose at a healthy level, especially if you use insulin or take certain oral diabetes medicines. It is important to know how many carbs you can safely have in each meal. This is different for every person. Your dietitian can help you calculate how many carbs you should have at each meal and for each snack. Foods that contain carbs include:  Bread, cereal, rice, pasta, and crackers.  Potatoes and corn.  Peas, beans, and lentils.  Milk and yogurt.  Fruit and juice.  Desserts, such as cakes, cookies, ice cream, and candy. How does alcohol  affect me? Alcohol can cause a sudden decrease in blood glucose (hypoglycemia), especially if you use insulin or take certain oral diabetes medicines. Hypoglycemia can be a life-threatening condition. Symptoms of hypoglycemia (sleepiness, dizziness, and confusion) are similar to symptoms of having too much alcohol. If your health care provider says that alcohol is safe for you, follow these guidelines:  Limit alcohol intake to no more than 1 drink per day for nonpregnant women and 2 drinks per day for men. One drink equals 12 oz of beer, 5 oz of  wine, or 1 oz of hard liquor.  Do not drink on an empty stomach.  Keep yourself hydrated with water, diet soda, or unsweetened iced tea.  Keep in mind that regular soda, juice, and other mixers may contain a lot of sugar and must be counted as carbs. What are tips for following this plan?  Reading food labels  Start by checking the serving size on the "Nutrition Facts" label of packaged foods and drinks. The amount of calories, carbs, fats, and other nutrients listed on the label is based on one serving of the item. Many items contain more than one serving per package.  Check the total grams (g) of carbs in one serving. You can calculate the number of servings of carbs in one serving by dividing the total carbs by 15. For example, if a food has 30 g of total carbs, it would be equal to 2 servings of carbs.  Check the number of grams (g) of saturated and trans fats in one serving. Choose foods that have low or no amount of these fats.  Check the number of milligrams (mg) of salt (sodium) in one serving. Most people should limit total sodium intake to less than 2,300 mg per day.  Always check the nutrition information of foods labeled as "low-fat" or "nonfat". These foods may be higher in added sugar or refined carbs and should be avoided.  Talk to your dietitian to identify your daily goals for nutrients listed on the label. Shopping  Avoid buying canned, premade, or processed foods. These foods tend to be high in fat, sodium, and added sugar.  Shop around the outside edge of the grocery store. This includes fresh fruits and vegetables, bulk grains, fresh meats, and fresh dairy. Cooking  Use low-heat cooking methods, such as baking, instead of high-heat cooking methods like deep frying.  Cook using healthy oils, such as olive, canola, or sunflower oil.  Avoid cooking with butter, cream, or high-fat meats. Meal planning  Eat meals and snacks regularly, preferably at the same times  every day. Avoid going long periods of time without eating.  Eat foods high in fiber, such as fresh fruits, vegetables, beans, and whole grains. Talk to your dietitian about how many servings of carbs you can eat at each meal.  Eat 4-6 ounces (oz) of lean protein each day, such as lean meat, chicken, fish, eggs, or tofu. One oz of lean protein is equal to: ? 1 oz of meat, chicken, or fish. ? 1 egg. ?  cup of tofu.  Eat some foods each day that contain healthy fats, such as avocado, nuts, seeds, and fish. Lifestyle  Check your blood glucose regularly.  Exercise regularly as told by your health care provider. This may include: ? 150 minutes of moderate-intensity or vigorous-intensity exercise each week. This could be brisk walking, biking, or water aerobics. ? Stretching and doing strength exercises, such as yoga or weightlifting, at least 2 times a  week.  Take medicines as told by your health care provider.  Do not use any products that contain nicotine or tobacco, such as cigarettes and e-cigarettes. If you need help quitting, ask your health care provider.  Work with a Social worker or diabetes educator to identify strategies to manage stress and any emotional and social challenges. Questions to ask a health care provider  Do I need to meet with a diabetes educator?  Do I need to meet with a dietitian?  What number can I call if I have questions?  When are the best times to check my blood glucose? Where to find more information:  American Diabetes Association: diabetes.org  Academy of Nutrition and Dietetics: www.eatright.[employer] of Diabetes and Digestive and Kidney Diseases (NIH): [employer] Summary  A healthy meal plan will help you control your blood glucose and maintain a healthy lifestyle.  Working with a diet and nutrition specialist (dietitian) can help you make a meal plan that is best for you.  Keep in mind that carbohydrates (carbs) and  alcohol have immediate effects on your blood glucose levels. It is important to count carbs and to use alcohol carefully. This information is not intended to replace advice given to you by your health care provider. Make sure you discuss any questions you have with your health care provider. Document Released: 09/23/2004 Document Revised: 12/09/2016 Document Reviewed: 02/01/2016 Elsevier Patient Education  2020 [employer].

## 2019-01-09 LAB — MICROALBUMIN / CREATININE URINE RATIO
Creatinine, Urine: 138 mg/dL (ref 20–320)
Microalb Creat Ratio: 3 mcg/mg creat (ref <30)
Microalb, Ur: 0.4 mg/dL

## 2019-03-25 ENCOUNTER — Ambulatory Visit: Payer: Self-pay | Admitting: Physician Assistant

## 2019-03-25 ENCOUNTER — Other Ambulatory Visit: Payer: Self-pay

## 2019-03-25 ENCOUNTER — Encounter: Payer: Self-pay | Admitting: Physician Assistant

## 2019-03-25 DIAGNOSIS — A5401 Gonococcal cystitis and urethritis, unspecified: Secondary | ICD-10-CM

## 2019-03-25 DIAGNOSIS — Z113 Encounter for screening for infections with a predominantly sexual mode of transmission: Secondary | ICD-10-CM

## 2019-03-25 LAB — GRAM STAIN

## 2019-03-25 MED ORDER — CEFTRIAXONE SODIUM 250 MG IJ SOLR
500.0000 mg | Freq: Once | INTRAMUSCULAR | Status: AC
  Administered 2019-03-25: 500 mg via INTRAMUSCULAR

## 2019-03-25 MED ORDER — DOXYCYCLINE HYCLATE 100 MG PO TABS: 100.0000 mg | ORAL_TABLET | Freq: Two times a day (BID) | ORAL | 0 refills | Status: AC

## 2019-03-25 NOTE — Progress Notes (Signed)
Pt is here for STD screening.Ronny Bacon, RN

## 2019-03-25 NOTE — Progress Notes (Signed)
Gram stain reviewed and pt treated for gonorrhea her Antoine Primas, [place] order. Pt tolerated well. Provider orders completed.Ronny Bacon, RN

## 2019-03-27 NOTE — Progress Notes (Signed)
[facility] Department STI clinic/screening visit  Subjective:  Sergio Fleming is a 54 y.o. male being seen today for an STI screening visit. The patient reports they do have symptoms.    Patient has the following medical conditions:   Patient Active Problem List   Diagnosis Date Noted  . Uncontrolled type 2 diabetes mellitus with hyperglycemia ([place]) 12/18/2018  . Hyperlipidemia associated with type 2 diabetes mellitus ([place]) 12/18/2018  . Class 2 severe obesity due to excess calories with serious comorbidity and body mass index (BMI) of 37.0 to 37.9 in adult [facility]) 12/17/2018  . OSA (obstructive sleep apnea) 03/14/2017  . Forearm mass 09/21/2015  . Special screening for malignant neoplasms, colon   . Benign neoplasm of sigmoid colon   . Benign neoplasm of descending colon      Chief Complaint  Patient presents with  . SEXUALLY TRANSMITTED DISEASE    STD screening    HPI  Patient reports that he has had white penile discharge and dysuria for 1 week.  Also, asks about testing for HSV-2.  Denies other symptoms today.  States that he is not on any medications currently and denies history of any chronic conditions and surgeries.   See flowsheet for further details and programmatic requirements.    The following portions of the patient's history were reviewed and updated as appropriate: allergies, current medications, past medical history, past social history, past surgical history and problem list.  Objective:  There were no vitals filed for this visit.  Physical Exam Constitutional:      General: He is not in acute distress.    Appearance: Normal appearance.  HENT:     Head: Normocephalic and atraumatic.     Comments: No nits, lice, or hair loss. No cervical, supraclavicular or axillary adenopathy.    Mouth/Throat:     Mouth: Mucous membranes are moist.     Pharynx: Oropharynx is clear. No oropharyngeal exudate or posterior oropharyngeal erythema.  Eyes:   Conjunctiva/sclera: Conjunctivae normal.  Pulmonary:     Effort: Pulmonary effort is normal.  Abdominal:     Palpations: Abdomen is soft. There is no mass.     Tenderness: There is no abdominal tenderness. There is no guarding or rebound.  Genitourinary:    Penis: Normal.      Testes: Normal.     Comments: Pubic area without nits, lice, edema, erythema, lesions and inguinal adenopathy. Penis circumcised and with yellowish discharge at meatus. Musculoskeletal:     Cervical back: Neck supple. No tenderness.  Skin:    General: Skin is warm and dry.     Findings: No bruising, erythema, lesion or rash.  Neurological:     Mental Status: He is alert and oriented to person, place, and time.  Psychiatric:        Mood and Affect: Mood normal.        Behavior: Behavior normal.        Thought Content: Thought content normal.        Judgment: Judgment normal.       Assessment and Plan:  Sergio Fleming is a 54 y.o. male presenting to the [facility] Department for STI screening  1. Screening for STD (sexually transmitted disease) Patient into clinic with symptoms. Rec condoms with all sex. Await test results.  Counseled that RN will call if needs to RTC for further treatment once results are back. Counseled that he can discuss having HSV serology done with PCP or RTC for culture  if he has lesions.  - Gram stain - HIV [facility] - Syphilis Serology, [facility]  2. 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 No sex until after treatment completed and partner completes treatment. - cefTRIAXone (ROCEPHIN) injection 500 mg - doxycycline (VIBRA-TABS) 100 MG tablet; Take 1 tablet (100 mg total) by mouth 2 (two) times daily for 7 days.  Dispense: 14 tablet; Refill: 0     Return in about 3 months (around 06/25/2019) for TOC and PRN.  Future Appointments  Date Time Provider [place]  06/13/2019  10:20 AM Hubbard Hartshorn, [place], [place]

## 2019-04-04 ENCOUNTER — Ambulatory Visit (INDEPENDENT_AMBULATORY_CARE_PROVIDER_SITE_OTHER): Payer: BLUE CROSS/BLUE SHIELD | Admitting: Family Medicine

## 2019-04-04 ENCOUNTER — Encounter: Payer: Self-pay | Admitting: Family Medicine

## 2019-04-04 ENCOUNTER — Other Ambulatory Visit: Payer: Self-pay

## 2019-04-04 VITALS — BP 122/82 | HR 96 | Temp 98.3°F | Resp 14 | Ht 68.0 in | Wt 236.2 lb

## 2019-04-04 DIAGNOSIS — Z202 Contact with and (suspected) exposure to infections with a predominantly sexual mode of transmission: Secondary | ICD-10-CM

## 2019-04-04 NOTE — Progress Notes (Signed)
Patient ID: Sergio Fleming, male    DOB: [birth year], 54 y.o.   MRN: [id number]  PCP: Hubbard Hartshorn, FNP  Chief Complaint  Patient presents with  . Exposure to STD    wants tested for HSV    Subjective:   Sergio Fleming is a 53 y.o. male, presents to clinic with CC of the following:  Presents for STD testing specifically for HSV due to a exposure with a new male sexual partner who has had unprotected sex with at least 2 times in the past month.  She told him that she has HSV-2, he [facility] the [facility] [place] testing last week at the same time he developed some dysuria and he was positive for gonorrhea and has subsequently completed treatment.  He was told that he would have to go elsewhere to get HSV testing done.  He does not know if she had any active lesions or sores she did not mention any.  He has not had any genital rash lesions ulcerations but he does have a crack on his mouth that is bothering him but he had it before he met her.  He denies any urinary symptoms right now.     Patient Active Problem List   Diagnosis Date Noted  . Uncontrolled type 2 diabetes mellitus with hyperglycemia ([place]) 12/18/2018  . Hyperlipidemia associated with type 2 diabetes mellitus ([place]) 12/18/2018  . Class 2 severe obesity due to excess calories with serious comorbidity and body mass index (BMI) of 37.0 to 37.9 in adult [facility]) 12/17/2018  . OSA (obstructive sleep apnea) 03/14/2017  . Forearm mass 09/21/2015  . Special screening for malignant neoplasms, colon   . Benign neoplasm of sigmoid colon   . Benign neoplasm of descending colon      No current outpatient medications on file.   No Known Allergies   Family History  Problem Relation Age of Onset  . Ovarian cancer Mother   . Pancreatic cancer Father   . Other Sister        Non-cancerous brain tumor     Social History   Socioeconomic History  . Marital status: Married    Spouse name: Barnett Applebaum  . Number of children: 2    . Years of education: Not on file  . Highest education level: Not on file  Occupational History  . Not on file  Tobacco Use  . Smoking status: Never Smoker  . Smokeless tobacco: Never Used  Substance and Sexual Activity  . Alcohol use: No    Comment: rare  . Drug use: No  . Sexual activity: Never    Partners: Female  Other Topics Concern  . 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   Social Determinants of Health   Financial Resource Strain:   . Difficulty of Paying Living Expenses:   Food Insecurity:   . Worried About Charity fundraiser in the Last Year:   . Arboriculturist in the Last Year:   Transportation Needs:   . Film/video editor (Medical):   Marland Kitchen Lack of Transportation (Non-Medical):   Physical Activity:   . Days of Exercise per Week:   . Minutes of Exercise per Session:   Stress:   . Feeling of Stress :   Social Connections: Unknown  . Frequency of Communication with Friends and Family: More than three times a week  .  Frequency of Social Gatherings with Friends and Family: Never  . Attends Religious Services: Not on file  . Active Member of Clubs or Organizations: Not on file  . Attends Archivist Meetings: Not on file  . Marital Status: Not on file  Intimate Partner Violence:   . Fear of Current or Ex-Partner:   . Emotionally Abused:   Marland Kitchen Physically Abused:   . Sexually Abused:     Chart Review Today: I personally reviewed active problem list, medication list, allergies, family history, social history, health maintenance, notes from last encounter, lab results, imaging with the patient/caregiver today.   Review of Systems 10 Systems reviewed and are negative for acute change except as noted in the HPI.     Objective:   Vitals:   04/04/19 1336  BP: 122/82  Pulse: 96  Resp: 14  Temp: 98.3 F (36.8 C)  SpO2: 95%  Weight: 236 lb 3.2 oz (107.1 kg)  Height:  5' 8" (1.727 m)    Body mass index is 35.91 kg/m.  Physical Exam Vitals and nursing note reviewed.  Constitutional:      General: He is not in acute distress.    Appearance: Normal appearance. He is well-developed. He is not ill-appearing, toxic-appearing or diaphoretic.  HENT:     Head: Normocephalic and atraumatic.     Nose: Nose normal.  Eyes:     General:        Right eye: No discharge.        Left eye: No discharge.     Conjunctiva/sclera: Conjunctivae normal.  Neck:     Trachea: No tracheal deviation.  Cardiovascular:     Rate and Rhythm: Normal rate and regular rhythm.     Pulses: Normal pulses.     Heart sounds: Normal heart sounds.  Pulmonary:     Effort: Pulmonary effort is normal. No respiratory distress.     Breath sounds: Normal breath sounds. No stridor.  Abdominal:     General: Bowel sounds are normal. There is no distension.  Musculoskeletal:        General: Normal range of motion.  Skin:    General: Skin is warm and dry.     Findings: No rash.  Neurological:     Mental Status: He is alert.     Motor: No abnormal muscle tone.     Coordination: Coordination normal.    refused GU exam    Results for orders placed or performed in visit on 03/25/19  Gram stain  Result Value Ref Range   Gram Stain Result Comment:         Assessment & Plan:      ICD-10-CM   1. 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  We discussed utility of testing without a recent lesion.  Discussed the possibility of a false negative, or finding that he has had a past exposure to herpes.  Patient does want proceed with the test, handout was given from the CDC regarding HSV, he recently had gonorrhea but stated he had completed treatment and was asymptomatic and was not  told he needed to do any follow-up testing.     Delsa Grana, PA-C 04/04/19 1:49 PM

## 2019-04-09 LAB — SPECIMEN STATUS

## 2019-04-09 LAB — SPECIMEN STATUS REPORT

## 2019-04-09 LAB — HSV(HERPES SMPLX)ABS-I+II(IGG+IGM)-BLD
HSV 1 Glycoprotein G Ab, IgG: 47.2 index — ABNORMAL HIGH (ref 0.00–0.90)
HSV 2 IgG, Type Spec: <0.91 index (ref 0.00–0.90)
HSVI/II Comb IgM: <0.91 Ratio (ref 0.00–0.90)

## 2019-06-13 ENCOUNTER — Ambulatory Visit: Payer: BLUE CROSS/BLUE SHIELD | Admitting: Family Medicine

## 2019-06-13 ENCOUNTER — Ambulatory Visit: Payer: BLUE CROSS/BLUE SHIELD | Admitting: Internal Medicine

## 2019-08-15 ENCOUNTER — Ambulatory Visit: Payer: BLUE CROSS/BLUE SHIELD | Admitting: Internal Medicine

## 2019-09-12 ENCOUNTER — Ambulatory Visit: Payer: BLUE CROSS/BLUE SHIELD | Admitting: Internal Medicine

## 2019-10-14 ENCOUNTER — Ambulatory Visit (INDEPENDENT_AMBULATORY_CARE_PROVIDER_SITE_OTHER): Payer: Self-pay | Admitting: Family Medicine

## 2019-10-14 ENCOUNTER — Encounter: Payer: Self-pay | Admitting: Family Medicine

## 2019-10-14 ENCOUNTER — Other Ambulatory Visit: Payer: Self-pay

## 2019-10-14 VITALS — BP 134/78 | HR 81 | Temp 98.2°F | Ht 68.0 in | Wt 237.0 lb

## 2019-10-14 DIAGNOSIS — Z024 Encounter for examination for driving license: Secondary | ICD-10-CM | POA: Insufficient documentation

## 2019-10-14 NOTE — Progress Notes (Signed)
Subjective:    Patient ID: Sergio Fleming, male    DOB: Nov 28, 1965, 54 y.o.   MRN: [id number]  Sergio Fleming is a 54 y.o. male presenting on 10/14/2019 for Employment Physical (DOT Physical)   HPI  [name] presents to clinic for DOT physical  Depression screen [facility] 2/9 04/04/2019 01/08/2019 12/17/2018  Decreased Interest 0 0 0  Down, Depressed, Hopeless 0 0 0  PHQ - 2 Score 0 0 0  Altered sleeping 0 0 0  Tired, decreased energy 0 0 0  Change in appetite 0 0 0  Feeling bad or failure about yourself  0 0 0  Trouble concentrating 0 0 0  Moving slowly or fidgety/restless 0 0 0  Suicidal thoughts 0 0 0  PHQ-9 Score 0 0 0  Difficult doing work/chores Not difficult at all Not difficult at all Not difficult at all    Social History   Tobacco Use  . Smoking status: Never Smoker  . Smokeless tobacco: Never Used  Vaping Use  . Vaping Use: Never used  Substance Use Topics  . Alcohol use: Yes    Comment: rare  . Drug use: No    Review of Systems  Constitutional: Negative.   HENT: Negative.   Eyes: Negative.   Respiratory: Negative.   Cardiovascular: Negative.   Gastrointestinal: Negative.   Endocrine: Negative.   Genitourinary: Negative.   Musculoskeletal: Negative.   Skin: Negative.   Allergic/Immunologic: Negative.   Neurological: Negative.   Hematological: Negative.   Psychiatric/Behavioral: Negative.    Per HPI unless specifically indicated above     Objective:    BP 134/78 (BP Location: Right Arm, Patient Position: Sitting, Cuff Size: Normal)   Pulse 81   Temp 98.2 F (36.8 C) (Oral)   Ht 5\' 8"  (1.727 m)   Wt 237 lb (107.5 kg)   BMI 36.04 kg/m   Wt Readings from Last 3 Encounters:  10/14/19 237 lb (107.5 kg)  04/04/19 236 lb 3.2 oz (107.1 kg)  01/08/19 243 lb 12.8 oz (110.6 kg)    Physical Exam Vitals reviewed.  Constitutional:      General: He is not in acute distress.    Appearance: Normal appearance. He is well-developed and well-groomed. He is  obese. He is not ill-appearing or toxic-appearing.  HENT:     Head: Normocephalic.     Right Ear: Tympanic membrane, ear canal and external ear normal. There is no impacted cerumen.     Left Ear: Tympanic membrane, ear canal and external ear normal. There is no impacted cerumen.     Nose: Nose normal. No congestion or rhinorrhea.     Mouth/Throat:     Mouth: Mucous membranes are moist.     Pharynx: Oropharynx is clear. No oropharyngeal exudate or posterior oropharyngeal erythema.  Eyes:     General: Lids are normal. Vision grossly intact. No scleral icterus.       Right eye: No discharge.        Left eye: No discharge.     Extraocular Movements: Extraocular movements intact.     Conjunctiva/sclera: Conjunctivae normal.     Pupils: Pupils are equal, round, and reactive to light.  Cardiovascular:     Rate and Rhythm: Normal rate and regular rhythm.     Pulses: Normal pulses.          Dorsalis pedis pulses are 2+ on the right side and 2+ on the left side.     Heart sounds: Normal heart sounds. No  murmur heard.  No friction rub. No gallop.   Pulmonary:     Effort: Pulmonary effort is normal. No respiratory distress.     Breath sounds: Normal breath sounds. No wheezing, rhonchi or rales.  Abdominal:     General: Abdomen is flat. Bowel sounds are normal. There is no distension.     Palpations: Abdomen is soft. There is no hepatomegaly, splenomegaly or mass.     Tenderness: There is no abdominal tenderness. There is no right CVA tenderness, left CVA tenderness, guarding or rebound.     Hernia: No hernia is present.  Musculoskeletal:        General: Normal range of motion.     Cervical back: Normal range of motion and neck supple. No rigidity or tenderness.     Right lower leg: No edema.     Left lower leg: No edema.     Comments: Normal tone, 5/5 strength BUE & BLE  Feet:     Right foot:     Skin integrity: Skin integrity normal.     Left foot:     Skin integrity: Skin integrity  normal.  Lymphadenopathy:     Cervical: No cervical adenopathy.  Skin:    General: Skin is warm and dry.     Capillary Refill: Capillary refill takes less than 2 seconds.  Neurological:     General: No focal deficit present.     Mental Status: He is alert and oriented to person, place, and time.     Cranial Nerves: Cranial nerves are intact. No cranial nerve deficit.     Sensory: Sensation is intact. No sensory deficit.     Motor: Motor function is intact. No weakness.     Coordination: Coordination is intact. Coordination normal.     Gait: Gait is intact. Gait normal.     Deep Tendon Reflexes: Reflexes are normal and symmetric. Reflexes normal.  Psychiatric:        Attention and Perception: Attention and perception normal.        Mood and Affect: Mood and affect normal.        Speech: Speech normal.        Behavior: Behavior normal. Behavior is cooperative.        Thought Content: Thought content normal.        Cognition and Memory: Cognition and memory normal.        Judgment: Judgment normal.    Results for orders placed or performed in visit on 04/04/19  HSV(herpes smplx)abs-1+2(IgG+IgM)-bld  Result Value Ref Range   HSVI/II Comb IgM <0.91 0.00 - 0.90 Ratio   HSV 1 Glycoprotein G Ab, IgG 47.20 (H) 0.00 - 0.90 index   HSV 2 IgG, Type Spec <0.91 0.00 - 0.90 index  Specimen Status  Result Value Ref Range   WBC WILL FOLLOW    RBC WILL FOLLOW    Hemoglobin WILL FOLLOW    Hematocrit WILL FOLLOW    MCV WILL FOLLOW    MCH WILL FOLLOW    MCHC WILL FOLLOW    RDW WILL FOLLOW    Platelets WILL FOLLOW    Neutrophils WILL FOLLOW    Lymphs WILL FOLLOW    Monocytes WILL FOLLOW    Eos WILL FOLLOW    Basos WILL FOLLOW    Neutrophils Absolute WILL FOLLOW    Lymphocytes Absolute WILL FOLLOW    Monocytes Absolute WILL FOLLOW    EOS (ABSOLUTE) WILL FOLLOW    Basophils Absolute WILL FOLLOW  Immature Granulocytes WILL FOLLOW    Immature Grans (Abs) WILL FOLLOW    Hematology  Comments: WILL FOLLOW   Specimen status report  Result Value Ref Range   specimen status report Comment       Assessment & Plan:   Problem List Items Addressed This Visit      Other   Encounter for Department of Transportation (DOT) examination for trucking license - Primary    DOT Certificate provided x 1 year  Hearing test: Pass at 15' Vision: 20/20 R, 20/20 L, 20/20 Both Corrected  Urine 1.025, Neg Protein, Neg Glucose, Neg Hematuria  A1C 6.3% STOPBANG: 3/8          No orders of the defined types were placed in this encounter.   Follow up plan: No follow-ups on file.   Harlin Rain, [place] [facility] Nurse Practitioner [facility] 10/14/2019, 10:08 AM

## 2019-10-14 NOTE — Assessment & Plan Note (Signed)
DOT Certificate provided x 1 year  Hearing test: Pass at 15' Vision: 20/20 R, 20/20 L, 20/20 Both Corrected  Urine 1.025, Neg Protein, Neg Glucose, Neg Hematuria  A1C 6.3% STOPBANG: 3/8

## 2019-10-14 NOTE — Patient Instructions (Signed)
As we discussed, you have qualified for a 1 year certificate with your DOT Physical due to your diagnosis of diabetes mellitus.  Your A1C in clinic today was 6.3%.  Discuss this with your new primary care provider.  As we discussed, if your diabetes is well managed and returns to a "prediabetes" state, your next DOT certificate will not be limited to one year and could be eligible for 2 years.

## 2020-05-14 ENCOUNTER — Encounter: Payer: Self-pay | Admitting: Physician Assistant

## 2020-08-19 ENCOUNTER — Telehealth (INDEPENDENT_AMBULATORY_CARE_PROVIDER_SITE_OTHER): Payer: Self-pay | Admitting: Gastroenterology

## 2020-08-19 DIAGNOSIS — Z86010 Personal history of colonic polyps: Secondary | ICD-10-CM

## 2020-08-19 MED ORDER — CLENPIQ 10-3.5-12 MG-GM -GM/160ML PO SOLN: 1.0000 | Freq: Once | ORAL | 0 refills | Status: AC

## 2020-08-19 NOTE — Progress Notes (Signed)
Gastroenterology Pre-Procedure Review  Request Date: 09/10/2020 Requesting Physician: Dr. Allen Norris  PATIENT REVIEW QUESTIONS: The patient responded to the following health history questions as indicated:    1. Are you having any GI issues? no 2. Do you have a personal history of Polyps?  yes 3. Do you have a family history of Colon Cancer or Polyps?  Unsure of polyps and no for cancer 4. Diabetes Mellitus? yes (type 2) 5. Joint replacements in the past 12 months?no 6. Major health problems in the past 3 months?no 7. Any artificial heart valves, MVP, or defibrillator?no    MEDICATIONS & ALLERGIES:    Patient reports the following regarding taking any anticoagulation/antiplatelet therapy:   Plavix, Coumadin, Eliquis, Xarelto, Lovenox, Pradaxa, Brilinta, or Effient? no Aspirin? no  Patient confirms/reports the following medications:  Current Outpatient Medications  Medication Sig Dispense Refill   OZEMPIC, 0.25 OR 0.5 MG/DOSE, 2 MG/1.5ML SOPN Inject into the skin.     Sod Picosulfate-Mag Ox-Cit Acd (CLENPIQ) 10-3.5-12 MG-GM -GM/160ML SOLN Take 1 kit by mouth once for 1 dose. 320 mL 0   No current facility-administered medications for this visit.    Patient confirms/reports the following allergies:  No Known Allergies  Orders Placed This Encounter  Procedures   Procedural/ Surgical Case Request: COLONOSCOPY WITH PROPOFOL    Standing Status:   Standing    Number of Occurrences:   1    Order Specific Question:   Pre-op diagnosis    Answer:   History of colonic polyps z86.010    Order Specific Question:   CPT Code    Answer:   54656    AUTHORIZATION INFORMATION Primary Insurance: 1D#: Group #:  Secondary Insurance: 1D#: Group #:  SCHEDULE INFORMATION: Date: 09/10/2020 Time: Location: [place]

## 2020-08-27 ENCOUNTER — Encounter: Payer: Self-pay | Admitting: Gastroenterology

## 2020-09-08 ENCOUNTER — Other Ambulatory Visit: Payer: Self-pay

## 2020-09-08 NOTE — Progress Notes (Signed)
Insurance company did not cover the [employer] ask pharmacist if they cover the [place] she said they did with no charge. They said they documented the change and will give it to patient

## 2020-09-10 ENCOUNTER — Ambulatory Visit: Payer: 59 | Admitting: Anesthesiology

## 2020-09-10 ENCOUNTER — Encounter
Admission: RE | Disposition: A | Discharge status: Home / Self Care | Payer: Self-pay | Source: Ambulatory Visit | Attending: Gastroenterology

## 2020-09-10 ENCOUNTER — Other Ambulatory Visit: Payer: Self-pay

## 2020-09-10 ENCOUNTER — Ambulatory Visit
Admission: RE | Admit: 2020-09-10 | Discharge: 2020-09-10 | Disposition: A | Discharge status: Home / Self Care | Payer: 59 | Source: Ambulatory Visit | Attending: Gastroenterology | Admitting: Gastroenterology

## 2020-09-10 ENCOUNTER — Encounter: Payer: Self-pay | Admitting: Gastroenterology

## 2020-09-10 DIAGNOSIS — E119 Type 2 diabetes mellitus without complications: Secondary | ICD-10-CM | POA: Insufficient documentation

## 2020-09-10 DIAGNOSIS — K5730 Diverticulosis of large intestine without perforation or abscess without bleeding: Secondary | ICD-10-CM | POA: Diagnosis not present

## 2020-09-10 DIAGNOSIS — Z860100 Personal history of colon polyps, unspecified: Secondary | ICD-10-CM

## 2020-09-10 DIAGNOSIS — K648 Other hemorrhoids: Secondary | ICD-10-CM | POA: Diagnosis not present

## 2020-09-10 DIAGNOSIS — D122 Benign neoplasm of ascending colon: Secondary | ICD-10-CM | POA: Insufficient documentation

## 2020-09-10 DIAGNOSIS — K635 Polyp of colon: Secondary | ICD-10-CM | POA: Diagnosis not present

## 2020-09-10 DIAGNOSIS — G4730 Sleep apnea, unspecified: Secondary | ICD-10-CM | POA: Insufficient documentation

## 2020-09-10 DIAGNOSIS — Z1211 Encounter for screening for malignant neoplasm of colon: Secondary | ICD-10-CM | POA: Insufficient documentation

## 2020-09-10 DIAGNOSIS — Z9049 Acquired absence of other specified parts of digestive tract: Secondary | ICD-10-CM | POA: Insufficient documentation

## 2020-09-10 HISTORY — PX: POLYPECTOMY: SHX5525

## 2020-09-10 HISTORY — PX: COLONOSCOPY WITH PROPOFOL: SHX5780

## 2020-09-10 HISTORY — DX: Type 2 diabetes mellitus without complications: E11.9

## 2020-09-10 HISTORY — DX: Sleep apnea, unspecified: G47.30

## 2020-09-10 LAB — GLUCOSE, CAPILLARY
Glucose-Capillary: 138 mg/dL — ABNORMAL HIGH (ref 70–99)
Glucose-Capillary: 133 mg/dL — ABNORMAL HIGH (ref 70–99)

## 2020-09-10 SURGERY — COLONOSCOPY WITH PROPOFOL
Anesthesia: General | Site: Rectum

## 2020-09-10 MED ORDER — PROPOFOL 10 MG/ML IV BOLUS
INTRAVENOUS | Status: DC | PRN
  Administered 2020-09-10: 50 mg via INTRAVENOUS
  Administered 2020-09-10: 30 mg via INTRAVENOUS
  Administered 2020-09-10: 150 mg via INTRAVENOUS
  Administered 2020-09-10: 40 mg via INTRAVENOUS
  Administered 2020-09-10: 30 mg via INTRAVENOUS

## 2020-09-10 MED ORDER — ACETAMINOPHEN 325 MG PO TABS: 325.0000 mg | ORAL_TABLET | ORAL | Status: DC | PRN

## 2020-09-10 MED ORDER — ONDANSETRON HCL 4 MG/2ML IJ SOLN: 4.0000 mg | Freq: Once | INTRAMUSCULAR | Status: DC | PRN

## 2020-09-10 MED ORDER — LIDOCAINE HCL (CARDIAC) PF 100 MG/5ML IV SOSY
PREFILLED_SYRINGE | INTRAVENOUS | Status: DC | PRN
  Administered 2020-09-10: 30 mg via INTRAVENOUS

## 2020-09-10 MED ORDER — STERILE WATER FOR IRRIGATION IR SOLN: Status: DC | PRN

## 2020-09-10 MED ORDER — LACTATED RINGERS IV SOLN: INTRAVENOUS | Status: DC

## 2020-09-10 MED ORDER — SODIUM CHLORIDE 0.9 % IV SOLN: INTRAVENOUS | Status: DC

## 2020-09-10 MED ORDER — ACETAMINOPHEN 160 MG/5ML PO SOLN: 325.0000 mg | ORAL | Status: DC | PRN

## 2020-09-10 SURGICAL SUPPLY — 8 items
GOWN CVR UNV OPN BCK APRN NK (MISCELLANEOUS) ×2 IMPLANT
GOWN ISOL THUMB LOOP REG UNIV (MISCELLANEOUS) ×4
KIT PRC NS LF DISP ENDO (KITS) ×1 IMPLANT
KIT PROCEDURE OLYMPUS (KITS) ×2
MANIFOLD NEPTUNE II (INSTRUMENTS) ×2 IMPLANT
SNARE COLD EXACTO (MISCELLANEOUS) ×2 IMPLANT
TRAP ETRAP POLY (MISCELLANEOUS) ×2 IMPLANT
WATER STERILE IRR 250ML POUR (IV SOLUTION) ×2 IMPLANT

## 2020-09-10 NOTE — Transfer of Care (Signed)
Immediate Anesthesia Transfer of Care Note  Patient: Sergio Fleming  Procedure(s) Performed: COLONOSCOPY WITH BIOPSY (Rectum) POLYPECTOMY (Rectum)  Patient Location: PACU  Anesthesia Type: General  Level of Consciousness: awake, alert  and patient cooperative  Airway and Oxygen Therapy: Patient Spontanous Breathing and Patient connected to supplemental oxygen  Post-op Assessment: Post-op Vital signs reviewed, Patient's Cardiovascular Status Stable, Respiratory Function Stable, Patent Airway and No signs of Nausea or vomiting  Post-op Vital Signs: Reviewed and stable  Complications: No notable events documented.

## 2020-09-10 NOTE — Anesthesia Procedure Notes (Signed)
Date/Time: 09/10/2020 8:02 AM Performed by: Cameron Ali, CRNA Pre-anesthesia Checklist: Patient identified, Emergency Drugs available, Suction available, Timeout performed and Patient being monitored Patient Re-evaluated:Patient Re-evaluated prior to induction Oxygen Delivery Method: Nasal cannula Placement Confirmation: positive ETCO2

## 2020-09-10 NOTE — Anesthesia Postprocedure Evaluation (Signed)
Anesthesia Post Note  Patient: Sergio Fleming  Procedure(s) Performed: COLONOSCOPY WITH BIOPSY (Rectum) POLYPECTOMY (Rectum)     Patient location during evaluation: PACU Anesthesia Type: General Level of consciousness: awake Pain management: pain level controlled Vital Signs Assessment: post-procedure vital signs reviewed and stable Respiratory status: respiratory function stable Cardiovascular status: stable Postop Assessment: no signs of nausea or vomiting Anesthetic complications: no   No notable events documented.  Veda Canning

## 2020-09-10 NOTE — Anesthesia Preprocedure Evaluation (Signed)
Anesthesia Evaluation  Patient identified by MRN, date of birth, ID band Patient awake    Airway Mallampati: II  TM Distance: >3 FB     Dental   Pulmonary sleep apnea ,    Pulmonary exam normal        Cardiovascular negative cardio ROS   Rhythm:Regular Rate:Normal     Neuro/Psych    GI/Hepatic   Endo/Other  diabetes, Type 2  Renal/GU      Musculoskeletal   Abdominal   Peds  Hematology   Anesthesia Other Findings   Reproductive/Obstetrics                             Anesthesia Physical Anesthesia Plan  ASA: 2  Anesthesia Plan: General   Post-op Pain Management:    Induction: Intravenous  PONV Risk Score and Plan: Propofol infusion, TIVA and Treatment may vary due to age or medical condition  Airway Management Planned: Natural Airway and Nasal Cannula  Additional Equipment:   Intra-op Plan:   Post-operative Plan:   Informed Consent: I have reviewed the patients History and Physical, chart, labs and discussed the procedure including the risks, benefits and alternatives for the proposed anesthesia with the patient or authorized representative who has indicated his/her understanding and acceptance.       Plan Discussed with: CRNA  Anesthesia Plan Comments:         Anesthesia Quick Evaluation

## 2020-09-10 NOTE — Op Note (Signed)
[facility] Gastroenterology Patient Name: Sergio Fleming Procedure Date: 09/10/2020 7:20 AM MRN: [id number] Account #: [id number] Date of Birth: [birth year] Admit Type: Outpatient Age: 55 Room: [facility] OR ROOM 01 Gender: Male Note Status: Finalized Procedure:             Colonoscopy Indications:           High risk colon cancer surveillance: Personal history                         of colonic polyps Providers:             Lucilla Lame MD, MD Referring MD:          Astrid Divine. Uvaldo Rising (Referring MD) Medicines:             Propofol per Anesthesia Complications:         No immediate complications. Procedure:             Pre-Anesthesia Assessment:                        - Prior to the procedure, a History and Physical was                         performed, and patient medications and allergies were                         reviewed. The patient's tolerance of previous                         anesthesia was also reviewed. The risks and benefits                         of the procedure and the sedation options and risks                         were discussed with the patient. All questions were                         answered, and informed consent was obtained. Prior                         Anticoagulants: The patient has taken no previous                         anticoagulant or antiplatelet agents. ASA Grade                         Assessment: II - A patient with mild systemic disease.                         After reviewing the risks and benefits, the patient                         was deemed in satisfactory condition to undergo the                         procedure.  After obtaining informed consent, the colonoscope was                         passed under direct vision. Throughout the procedure,                         the patient's blood pressure, pulse, and oxygen                         saturations were monitored continuously. The                          Colonoscope was introduced through the anus and                         advanced to the the cecum, identified by appendiceal                         orifice and ileocecal valve. The colonoscopy was                         performed without difficulty. The patient tolerated                         the procedure well. The quality of the bowel                         preparation was excellent. Findings:      The perianal and digital rectal examinations were normal.      Two sessile polyps were found in the ascending colon. The polyps were 2       to 5 mm in size. These polyps were removed with a cold snare. Resection       and retrieval were complete.      A few small-mouthed diverticula were found in the entire colon.      Non-bleeding internal hemorrhoids were found during retroflexion. The       hemorrhoids were Grade I (internal hemorrhoids that do not prolapse). Impression:            - Two 2 to 5 mm polyps in the ascending colon, removed                         with a cold snare. Resected and retrieved.                        - Diverticulosis in the entire examined colon.                        - Non-bleeding internal hemorrhoids. Recommendation:        - Discharge patient to home.                        - Resume previous diet.                        - Continue present medications.                        - Await pathology results.                        -  Repeat colonoscopy in 7 years for surveillance. Procedure Code(s):     --- Professional ---                        [phone], Colonoscopy, flexible; with removal of                         tumor(s), polyp(s), or other lesion(s) by snare                         technique Diagnosis Code(s):     --- Professional ---                        Z86.010, Personal history of colonic polyps                        K63.5, Polyp of colon CPT copyright 2019 American Medical Association. All rights reserved. The codes documented in this report are  preliminary and upon coder review may  be revised to meet current compliance requirements. Lucilla Lame MD, MD 09/10/2020 8:04:12 AM This report has been signed electronically. Number of Addenda: 0 Note Initiated On: 09/10/2020 7:20 AM Scope Withdrawal Time: 0 hours 6 minutes 21 seconds  Total Procedure Duration: 0 hours 9 minutes 35 seconds  Estimated Blood Loss:  Estimated blood loss: none.      [facility]

## 2020-09-10 NOTE — H&P (Signed)
   Lucilla Lame, MD [facility] [address], [place], [place] [address] Phone:[phone] Fax : [phone]  Primary Care Physician:  Hubbard Hartshorn, FNP Primary Gastroenterologist:  Dr. Allen Norris  Pre-Procedure History & Physical: HPI:  Sergio Fleming is a 55 y.o. male is here for an colonoscopy.   Past Medical History:  Diagnosis Date   Diabetes mellitus without complication ([place])    Sleep apnea     Past Surgical History:  Procedure Laterality Date   CHOLECYSTECTOMY     COLONOSCOPY WITH PROPOFOL N/A 03/19/2015   Procedure: COLONOSCOPY WITH PROPOFOL;  Surgeon: Lucilla Lame, MD;  Location: [place];  Service: Endoscopy;  Laterality: N/A;   POLYPECTOMY  03/19/2015   Procedure: POLYPECTOMY;  Surgeon: Lucilla Lame, MD;  Location: [place];  Service: Endoscopy;;    Prior to Admission medications   Medication Sig Start Date End Date Taking? Authorizing Provider  OZEMPIC, 0.25 OR 0.5 MG/DOSE, 2 MG/1.5ML SOPN Inject into the skin. 07/27/20  Yes [provider]    Allergies as of 08/19/2020   (No Known Allergies)    Family History  Problem Relation Age of Onset   Ovarian cancer Mother    Pancreatic cancer Father    Other Sister        Non-cancerous brain tumor    Social History   Socioeconomic History   Marital status: Married    Spouse name: Barnett Applebaum   Number of children: 2   Years of education: Not on file   Highest education level: Not on file  Occupational History   Not on file  Tobacco Use   Smoking status: Never   Smokeless tobacco: Never  Vaping Use   Vaping Use: Never used  Substance and Sexual Activity   Alcohol use: Yes    Comment: rare   Drug use: No   Sexual activity: Never    Partners: Female  Other Topics Concern   Not on file  Social History Narrative   Owns a trucking company   Lives at home with wife, two sons (9yo and 35yo).   Social Determinants of Health   Financial Resource Strain: Not on file  Food Insecurity:  Not on file  Transportation Needs: Not on file  Physical Activity: Not on file  Stress: Not on file  Social Connections: Not on file  Intimate Partner Violence: Not on file    Review of Systems: See HPI, otherwise negative ROS  Physical Exam: BP (!) 122/92   Pulse 76   Temp 97.6 F (36.4 C)   Ht 5' 8.5" (1.74 m)   Wt 107.5 kg   SpO2 96%   BMI 35.51 kg/m  General:   Alert,  pleasant and cooperative in NAD Head:  Normocephalic and atraumatic. Neck:  Supple; no masses or thyromegaly. Lungs:  Clear throughout to auscultation.    Heart:  Regular rate and rhythm. Abdomen:  Soft, nontender and nondistended. Normal bowel sounds, without guarding, and without rebound.   Neurologic:  Alert and  oriented x4;  grossly normal neurologically.  Impression/Plan: Sergio Fleming is here for an colonoscopy to be performed for a history of adenomatous polyps on 2017  Risks, benefits, limitations, and alternatives regarding  colonoscopy have been reviewed with the patient.  Questions have been answered.  All parties agreeable.   Lucilla Lame, MD  09/10/2020, 7:23 AM

## 2020-09-11 ENCOUNTER — Encounter: Payer: Self-pay | Admitting: Gastroenterology

## 2020-09-11 LAB — SURGICAL PATHOLOGY

## 2020-09-13 ENCOUNTER — Encounter: Payer: Self-pay | Admitting: Gastroenterology

## 2021-05-04 ENCOUNTER — Ambulatory Visit: Payer: Self-pay

## 2021-05-04 NOTE — Telephone Encounter (Signed)
Call transferred from agent. No triage needed. Pt not having any issues. Appointment made by agent. ?

## 2021-05-10 ENCOUNTER — Encounter: Payer: Self-pay | Admitting: Internal Medicine

## 2021-05-10 ENCOUNTER — Ambulatory Visit (INDEPENDENT_AMBULATORY_CARE_PROVIDER_SITE_OTHER): Payer: 59 | Admitting: Internal Medicine

## 2021-05-10 VITALS — BP 140/88 | HR 102 | Temp 98.0°F | Resp 16 | Ht 68.0 in | Wt 244.5 lb

## 2021-05-10 DIAGNOSIS — R5383 Other fatigue: Secondary | ICD-10-CM | POA: Diagnosis not present

## 2021-05-10 DIAGNOSIS — E1165 Type 2 diabetes mellitus with hyperglycemia: Secondary | ICD-10-CM | POA: Diagnosis not present

## 2021-05-10 DIAGNOSIS — Z125 Encounter for screening for malignant neoplasm of prostate: Secondary | ICD-10-CM | POA: Diagnosis not present

## 2021-05-10 DIAGNOSIS — R0601 Orthopnea: Secondary | ICD-10-CM

## 2021-05-10 DIAGNOSIS — E1169 Type 2 diabetes mellitus with other specified complication: Secondary | ICD-10-CM | POA: Diagnosis not present

## 2021-05-10 DIAGNOSIS — Z0000 Encounter for general adult medical examination without abnormal findings: Secondary | ICD-10-CM | POA: Diagnosis not present

## 2021-05-10 DIAGNOSIS — E785 Hyperlipidemia, unspecified: Secondary | ICD-10-CM

## 2021-05-10 NOTE — Progress Notes (Signed)
Name: Sergio Fleming   MRN: [id number]    DOB: 1965-03-01   Date:05/10/2021 ? ?     Progress Note ? ?Subjective ? ?Chief Complaint ? ?Chief Complaint  ?Patient presents with  ? Annual Exam  ? ? ?HPI ? ?Patient presents for annual CPE. Has one complaint today of orthopnea. States he has a hard time breathing when laying flat on his back and has to either sleep on his side or with 1 pillow propped up. This has been going on for years but noticed it worse lately.  Does occasionally wake up gasping for air.  Endorses snoring.  Has been diagnosed with OSA but does not wear his CPAP consistently.  More fatigued lately as well.  Denies chest pain, palpitations.  No history of pulmonary disease.  No wheezing.  Has never smoked.  Does have some ankle swelling when driving for long periods of time. Has a friend who was recently diagnosed with lung cancer, which the patient is worries about. ? ?Diet: Well rounded, eats sodium ?Exercise: Active at work but no regular regimen ? ?Depression: phq 9 is negative ? ?  05/10/2021  ?  2:21 PM 04/04/2019  ?  1:38 PM 01/08/2019  ? 10:55 AM 12/17/2018  ?  1:06 PM 03/14/2017  ?  8:33 AM  ?Depression screen PHQ 2/9  ?Decreased Interest 0 0 0 0 0  ?Down, Depressed, Hopeless 0 0 0 0 0  ?PHQ - 2 Score 0 0 0 0 0  ?Altered sleeping 0 0 0 0   ?Tired, decreased energy 0 0 0 0   ?Change in appetite 0 0 0 0   ?Feeling bad or failure about yourself  0 0 0 0   ?Trouble concentrating 0 0 0 0   ?Moving slowly or fidgety/restless 0 0 0 0   ?Suicidal thoughts 0 0 0 0   ?PHQ-9 Score 0 0 0 0   ?Difficult doing work/chores Not difficult at all Not difficult at all Not difficult at all Not difficult at all   ? ? ?Hypertension:  ?BP Readings from Last 3 Encounters:  ?05/10/21 140/88  ?09/10/20 110/70  ?10/14/19 134/78  ? ? ?Obesity: ?Wt Readings from Last 3 Encounters:  ?05/10/21 244 lb 8 oz (110.9 kg)  ?09/10/20 237 lb (107.5 kg)  ?10/14/19 237 lb (107.5 kg)  ? ?BMI Readings from Last 3 Encounters:  ?05/10/21 37.18 kg/m?   ?09/10/20 35.51 kg/m?  ?10/14/19 36.04 kg/m?  ?  ? ?Lipids:  ?Lab Results  ?Component Value Date  ? CHOL 287 (H) 12/17/2018  ? CHOL 276 (H) 03/14/2017  ? ?Lab Results  ?Component Value Date  ? HDL 46 12/17/2018  ? HDL 48 03/14/2017  ? ?Lab Results  ?Component Value Date  ? LDLCALC 186 (H) 12/17/2018  ? LDLCALC 189 (H) 03/14/2017  ? ?Lab Results  ?Component Value Date  ? TRIG 324 (H) 12/17/2018  ? TRIG 201 (H) 03/14/2017  ? ?Lab Results  ?Component Value Date  ? CHOLHDL 6.2 (H) 12/17/2018  ? CHOLHDL 5.8 (H) 03/14/2017  ? ?No results found for: LDLDIRECT ?Glucose:  ?Glucose, Bld  ?Date Value Ref Range Status  ?12/17/2018 193 (H) 65 - 99 mg/dL Final  ?  Comment:  ?  . ?           Fasting reference interval ?. ?For someone without known diabetes, a glucose ?value >125 mg/dL indicates that they may have ?diabetes and this should be confirmed with a ?follow-up test. ?. ?  ?03/14/2017  116 (H) 65 - 99 mg/dL Final  ?  Comment:  ?  . ?           Fasting reference interval ?. ?For someone without known diabetes, a glucose value ?between 100 and 125 mg/dL is consistent with ?prediabetes and should be confirmed with a ?follow-up test. ?. ?  ? ?Glucose-Capillary  ?Date Value Ref Range Status  ?09/10/2020 133 (H) 70 - 99 mg/dL Final  ?  Comment:  ?  Glucose reference range applies only to samples taken after fasting for at least 8 hours.  ?09/10/2020 138 (H) 70 - 99 mg/dL Final  ?  Comment:  ?  Glucose reference range applies only to samples taken after fasting for at least 8 hours.  ? ? ?[place] Office Visit from 04/04/2019 in [facility]  ?AUDIT-C Score 1  ? ?  ? ? ?Married ?STD testing and prevention (HIV/chl/gon/syphilis):  no ?Sexual history:  ?Hep C Screening: 03/14/2017, negative ?Skin cancer: Discussed monitoring for atypical lesions ?Colorectal cancer: Colonoscopy 9/22, repeat in 7 years ?Prostate cancer screening:  yes ?Lab Results  ?Component Value Date  ? PSA 0.8 12/17/2018  ? ? ? ?Lung cancer:   Low Dose CT Chest recommended if Age 74-80 years, 30 pack-year currently smoking OR have quit w/in 15years. Patient  no a candidate for screening   ?AAA: The USPSTF recommends one-time screening with ultrasonography in men ages 35 to 31 years who have ever smoked. Patient   no, a candidate for screening  ?ECG:  12/2018 ? ?Vaccines:  ? ?HPV: N/A ?Tdap: 12/2018 ?Shingrix: Completed  ?Pneumonia: Due ?Flu: Last in 2020 ?PNTIR-44: Uncertain ? ?Advanced Care Planning: A voluntary discussion about advance care planning including the explanation and discussion of advance directives.  Discussed health care proxy and Living will, and the patient was able to identify a health care proxy as Hriday Stai wife.  Patient does not have a living will and power of attorney of health care  ? ?Patient Active Problem List  ? Diagnosis Date Noted  ? History of colonic polyps   ? Polyp of ascending colon   ? Encounter for Department of Transportation (DOT) examination for trucking license [id number]  ? Uncontrolled type 2 diabetes mellitus with hyperglycemia ([place]) 12/18/2018  ? Hyperlipidemia associated with type 2 diabetes mellitus ([place]) 12/18/2018  ? Class 2 severe obesity due to excess calories with serious comorbidity and body mass index (BMI) of 37.0 to 37.9 in adult [facility]) 12/17/2018  ? OSA (obstructive sleep apnea) 03/14/2017  ? Forearm mass 09/21/2015  ? Special screening for malignant neoplasms, colon   ? Benign neoplasm of sigmoid colon   ? Benign neoplasm of descending colon   ? ? ?Past Surgical History:  ?Procedure Laterality Date  ? CHOLECYSTECTOMY    ? COLONOSCOPY WITH PROPOFOL N/A 03/19/2015  ? Procedure: COLONOSCOPY WITH PROPOFOL;  Surgeon: Lucilla Lame, MD;  Location: [place];  Service: Endoscopy;  Laterality: N/A;  ? COLONOSCOPY WITH PROPOFOL N/A 09/10/2020  ? Procedure: COLONOSCOPY WITH BIOPSY;  Surgeon: Lucilla Lame, MD;  Location: [place];  Service: Endoscopy;  Laterality: N/A;  diabetic  ?  POLYPECTOMY  03/19/2015  ? Procedure: POLYPECTOMY;  Surgeon: Lucilla Lame, MD;  Location: [place];  Service: Endoscopy;;  ? POLYPECTOMY N/A 09/10/2020  ? Procedure: POLYPECTOMY;  Surgeon: Lucilla Lame, MD;  Location: [place];  Service: Endoscopy;  Laterality: N/A;  ? ? ?Family History  ?Problem Relation Age of Onset  ?  Ovarian cancer Mother   ? Pancreatic cancer Father   ? Other Sister   ?     Non-cancerous brain tumor  ? ? ?Social History  ? ?Socioeconomic History  ? Marital status: Married  ?  Spouse name: Barnett Applebaum  ? Number of children: 2  ? Years of education: Not on file  ? Highest education level: Not on file  ?Occupational History  ? Not on file  ?Tobacco Use  ? Smoking status: Never  ? Smokeless tobacco: Never  ?Vaping Use  ? Vaping Use: Never used  ?Substance and Sexual Activity  ? Alcohol use: Yes  ?  Comment: rare  ? Drug use: No  ? Sexual activity: Never  ?  Partners: Female  ?Other Topics Concern  ? Not on file  ?Social History Narrative  ? Owns a [employer]  ? Lives at home with wife, two sons (9yo and 53yo).  ? ?Social Determinants of Health  ? ?Financial Resource Strain: Not on file  ?Food Insecurity: Not on file  ?Transportation Needs: Not on file  ?Physical Activity: Not on file  ?Stress: Not on file  ?Social Connections: Not on file  ?Intimate Partner Violence: Not on file  ? ? ? ?Current Outpatient Medications:  ?  OZEMPIC, 0.25 OR 0.5 MG/DOSE, 2 MG/1.5ML SOPN, Inject into the skin. (Patient not taking: Reported on 05/10/2021), Disp: , Rfl:  ? ?No Known Allergies ? ? ?Review of Systems  ?Constitutional:  Positive for malaise/fatigue. Negative for chills and fever.  ?Respiratory:  Positive for shortness of breath. Negative for cough and wheezing.   ?Cardiovascular:  Positive for orthopnea and PND. Negative for chest pain, palpitations and leg swelling.  ?Gastrointestinal:  Negative for abdominal pain, constipation, diarrhea, nausea and vomiting.  ?Neurological:  Negative for  dizziness and headaches.  ? ? ? ?Objective ? ?Vitals:  ? 05/10/21 1417  ?BP: 140/88  ?Pulse: (!) 102  ?Resp: 16  ?Temp: 98 ?F (36.7 ?C)  ?SpO2: 95%  ?Weight: 244 lb 8 oz (110.9 kg)  ?Height: '5\' 8"'$  (1.727 m)  ? ?

## 2021-05-10 NOTE — Patient Instructions (Addendum)
It was great seeing you today! ? ?Plan discussed at today's visit: ?-Blood work ordered today, results will be uploaded to [facility].  ?-EKG today ?-Referral to Cardiology for echo (ultrasound of heart) ? ?Follow up in: 1 month  ? ?Take care and let us know if you have any questions or concerns prior to your next visit. ? ?Dr. Rosana Berger ? ?

## 2021-05-11 LAB — CBC WITH DIFFERENTIAL/PLATELET
Absolute Monocytes: 502 cells/uL (ref 200–950)
Basophils Absolute: 49 cells/uL (ref 0–200)
Basophils Relative: 0.6 %
Eosinophils Absolute: 89 cells/uL (ref 15–500)
Eosinophils Relative: 1.1 %
HCT: 44.7 % (ref 38.5–50.0)
Hemoglobin: 15.2 g/dL (ref 13.2–17.1)
Lymphs Abs: 1912 cells/uL (ref 850–3900)
MCH: 29.7 pg (ref 27.0–33.0)
MCHC: 34 g/dL (ref 32.0–36.0)
MCV: 87.5 fL (ref 80.0–100.0)
MPV: 11.3 fL (ref 7.5–12.5)
Monocytes Relative: 6.2 %
Neutro Abs: 5549 cells/uL (ref 1500–7800)
Neutrophils Relative %: 68.5 %
Platelets: 293 10*3/uL (ref 140–400)
RBC: 5.11 10*6/uL (ref 4.20–5.80)
RDW: 12.2 % (ref 11.0–15.0)
Total Lymphocyte: 23.6 %
WBC: 8.1 10*3/uL (ref 3.8–10.8)

## 2021-05-11 LAB — PSA: PSA: 1.39 ng/mL (ref <=4.00)

## 2021-05-11 LAB — COMPLETE METABOLIC PANEL WITH GFR
AG Ratio: 1.4 (calc) (ref 1.0–2.5)
ALT: 43 U/L (ref 9–46)
AST: 22 U/L (ref 10–35)
Albumin: 4.3 g/dL (ref 3.6–5.1)
Alkaline phosphatase (APISO): 72 U/L (ref 35–144)
BUN: 14 mg/dL (ref 7–25)
CO2: 28 mmol/L (ref 20–32)
Calcium: 9.4 mg/dL (ref 8.6–10.3)
Chloride: 101 mmol/L (ref 98–110)
Creat: 1.08 mg/dL (ref 0.70–1.30)
Globulin: 3 g/dL (calc) (ref 1.9–3.7)
Glucose, Bld: 236 mg/dL — ABNORMAL HIGH (ref 65–99)
Potassium: 4.2 mmol/L (ref 3.5–5.3)
Sodium: 140 mmol/L (ref 135–146)
Total Bilirubin: 0.7 mg/dL (ref 0.2–1.2)
Total Protein: 7.3 g/dL (ref 6.1–8.1)
eGFR: 80 mL/min/{1.73_m2} (ref >=60)

## 2021-05-11 LAB — LIPID PANEL
Cholesterol: 276 mg/dL — ABNORMAL HIGH (ref <200)
HDL: 49 mg/dL (ref >=40)
LDL Cholesterol (Calc): 169 mg/dL (calc) — ABNORMAL HIGH
Non-HDL Cholesterol (Calc): 227 mg/dL (calc) — ABNORMAL HIGH (ref <130)
Total CHOL/HDL Ratio: 5.6 (calc) — ABNORMAL HIGH (ref <5.0)
Triglycerides: 345 mg/dL — ABNORMAL HIGH (ref <150)

## 2021-05-11 LAB — HEMOGLOBIN A1C
Hgb A1c MFr Bld: 8 % of total Hgb — ABNORMAL HIGH (ref <5.7)
Mean Plasma Glucose: 183 mg/dL
eAG (mmol/L): 10.1 mmol/L

## 2021-05-11 LAB — BRAIN NATRIURETIC PEPTIDE: Brain Natriuretic Peptide: 4 pg/mL (ref <100)

## 2021-05-11 LAB — MICROALBUMIN / CREATININE URINE RATIO
Creatinine, Urine: 117 mg/dL (ref 20–320)
Microalb Creat Ratio: 10 mcg/mg creat (ref <30)
Microalb, Ur: 1.2 mg/dL

## 2021-05-11 LAB — TSH: TSH: 1.94 mIU/L (ref 0.40–4.50)

## 2021-05-11 NOTE — Addendum Note (Signed)
Addended by: Teodora Medici on: 05/11/2021 03:31 PM ? ? Modules accepted: Orders ? ?

## 2021-05-13 ENCOUNTER — Ambulatory Visit
Admission: RE | Admit: 2021-05-13 | Discharge: 2021-05-13 | Disposition: A | Discharge status: Home / Self Care | Payer: 59 | Source: Ambulatory Visit | Attending: Internal Medicine | Admitting: Internal Medicine

## 2021-05-13 ENCOUNTER — Ambulatory Visit
Admission: RE | Admit: 2021-05-13 | Discharge: 2021-05-13 | Disposition: A | Discharge status: Home / Self Care | Payer: 59 | Attending: Internal Medicine | Admitting: Internal Medicine

## 2021-05-13 DIAGNOSIS — R0601 Orthopnea: Secondary | ICD-10-CM | POA: Insufficient documentation

## 2021-05-14 ENCOUNTER — Encounter: Payer: 59 | Admitting: Cardiology

## 2021-05-14 ENCOUNTER — Telehealth: Payer: Self-pay

## 2021-05-14 ENCOUNTER — Encounter: Payer: Self-pay | Admitting: Cardiology

## 2021-05-14 DIAGNOSIS — R0601 Orthopnea: Secondary | ICD-10-CM

## 2021-05-14 NOTE — Telephone Encounter (Signed)
FYI ne referral will need to be placed else where ?

## 2021-05-14 NOTE — Telephone Encounter (Signed)
Patient was seen today as a new patient ambulatory referral from his PCP. When Dr. Garen Lah entered the patient room the patient began yelling loudly at him that it was heard by this nurse and the patient in the next room. He stated that he was here for a "scan". He was told he was coming for an Echocardiogram and had taken the time off of work for that. When Dr. Garen Lah came out of the room, he advised [place] to call security and discharge the patient from the practice.  ?Patient had exited the room and was at checkout complaining to Operating Room Services when she came back to ask me what they should do. I went out and tried to have a conversation with the patient, he again was complaining loudly that we were wasting his time. I asked him to please stop yelling at [place] and he turned around and left the office while yelling out to me, " I am sorry you are so sensitive".  ? ?Patient is being discharged from our practice. I will route this message to patients PCP as an FYI that patient will need to be referred elsewhere. Gabriel Cirri also called their office to let them know. ?

## 2021-05-14 NOTE — Telephone Encounter (Signed)
Copied from [place] [phone]. Topic: Referral - Status ?>> May 14, 2021  9:10 AM Alanda Slim E wrote: ?Reason for CRM: Pt showed up to [facility] thinking he was having a EKG / and pt was rude and yelling at provider and walked out / so they can longer see him and no provider at that office will see him / please advise ?

## 2021-05-17 ENCOUNTER — Encounter: Payer: Self-pay | Admitting: *Deleted

## 2021-05-17 ENCOUNTER — Telehealth: Payer: Self-pay | Admitting: Cardiology

## 2021-05-17 NOTE — Telephone Encounter (Signed)
Patient dismissed from [facility] and all providers practicing at this clinic 05/17/21 ?

## 2021-05-18 ENCOUNTER — Encounter: Payer: Self-pay | Admitting: Cardiology

## 2021-05-18 NOTE — Addendum Note (Signed)
Addended by: Teodora Medici on: 05/18/2021 08:55 AM ? ? Modules accepted: Orders ? ?

## 2021-06-08 ENCOUNTER — Ambulatory Visit: Payer: 59 | Admitting: Internal Medicine

## 2022-05-05 ENCOUNTER — Ambulatory Visit: Payer: BLUE CROSS/BLUE SHIELD | Admitting: Family Medicine

## 2022-05-05 DIAGNOSIS — Z113 Encounter for screening for infections with a predominantly sexual mode of transmission: Secondary | ICD-10-CM

## 2022-05-05 LAB — HM HEPATITIS C SCREENING LAB: HM Hepatitis Screen: NEGATIVE

## 2022-05-05 LAB — HM HIV SCREENING LAB: HM HIV Screening: NEGATIVE

## 2022-05-05 NOTE — Progress Notes (Signed)
Pt is here for STD screening and denies all symptoms.   Pt has had 2 partners in the last 60 days. Pt was not seen by a Provider.   Condoms declined.  Berdie Ogren, RN

## 2022-05-09 LAB — CHLAMYDIA/GC NAA, CONFIRMATION
Chlamydia trachomatis, NAA: NEGATIVE
Neisseria gonorrhoeae, NAA: NEGATIVE

## 2022-08-16 NOTE — Progress Notes (Signed)
This encounter was created in error - please disregard.

## 2022-08-22 IMAGING — CR DG CHEST 2V
1 series · 2 of 2 positions shown · non-contrast
Comparison: April 05, 2017

CLINICAL DATA: Shortness of breath.  Orthopnea.

EXAM:
CHEST - 2 VIEW

[Series 1: dg chest 2 view · 0.14mm/px · 2 of 2 slices shown]
[im 1/2]
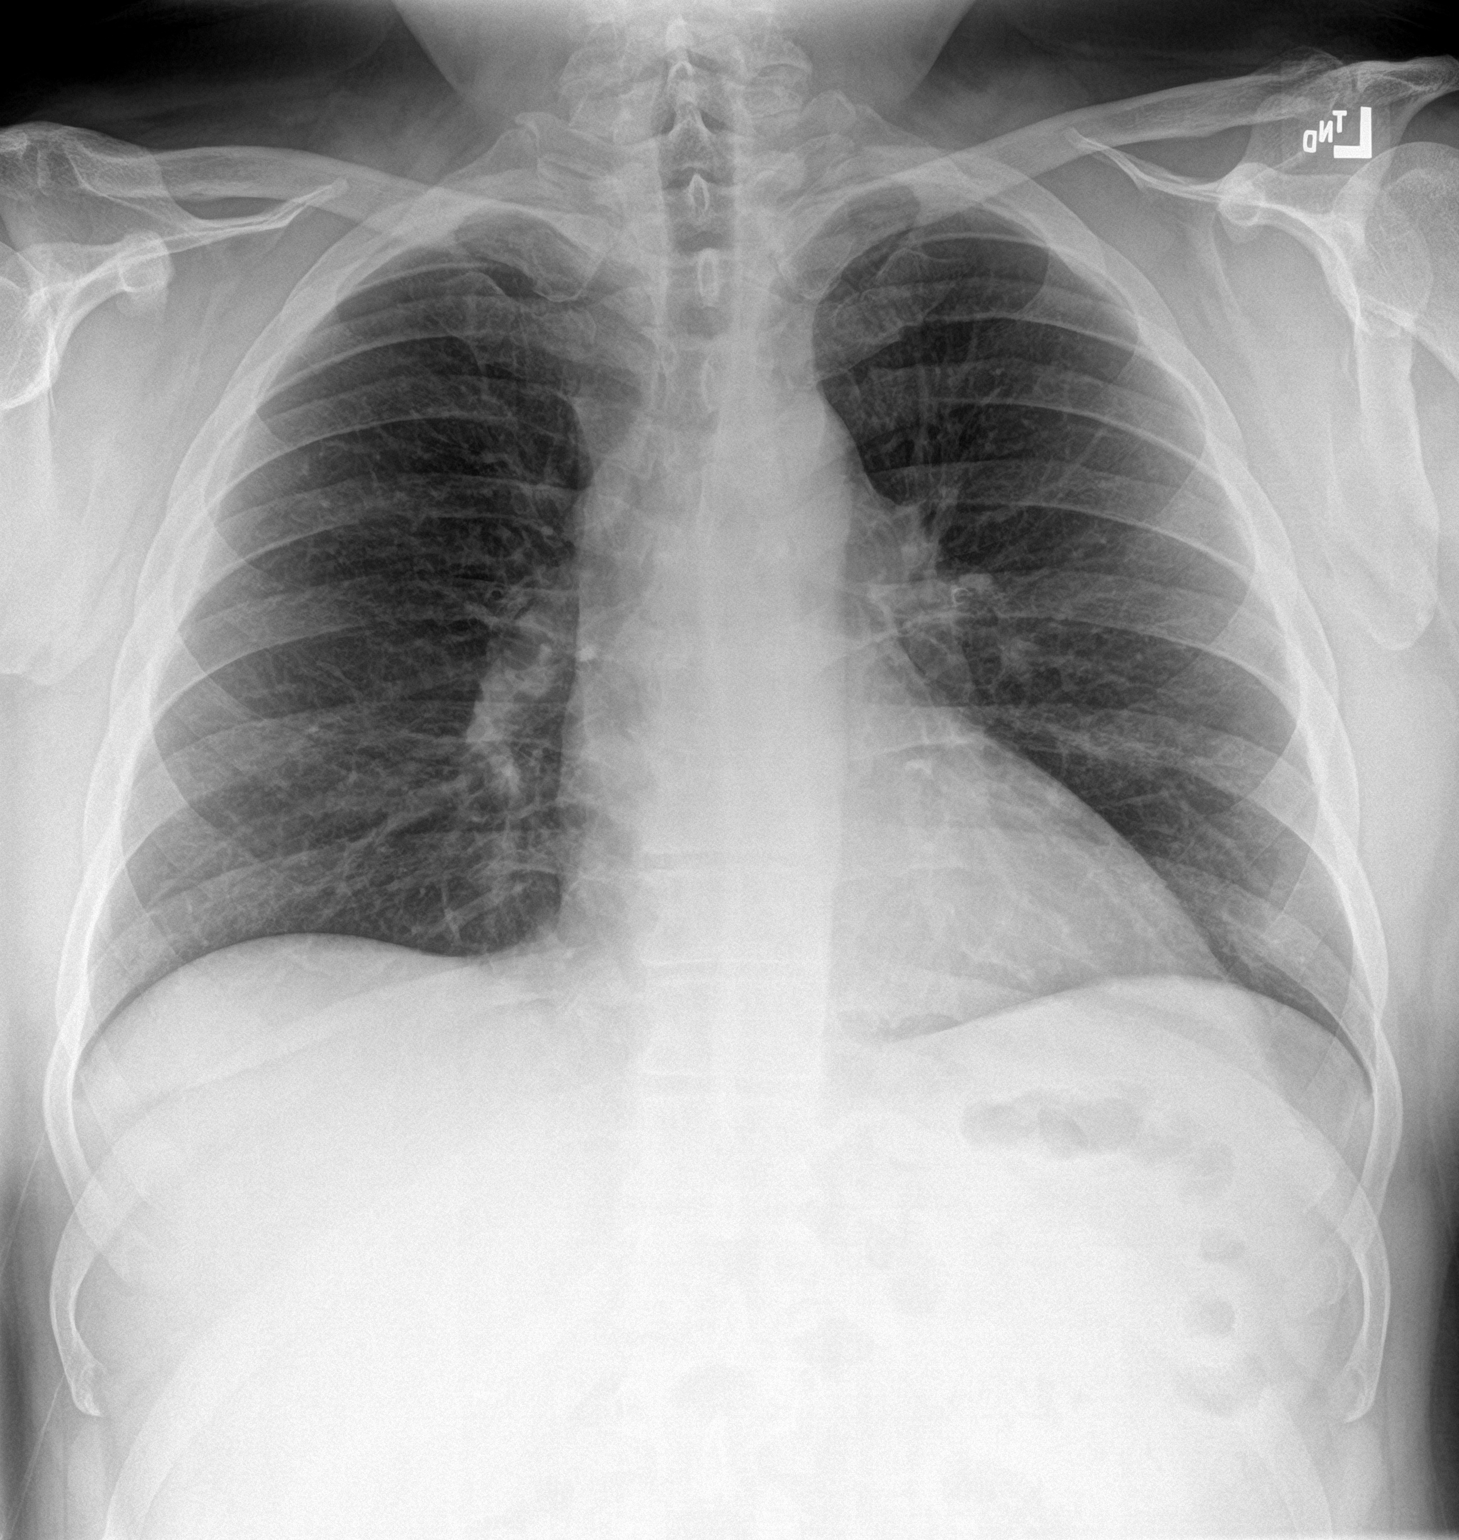
[im 2/2]
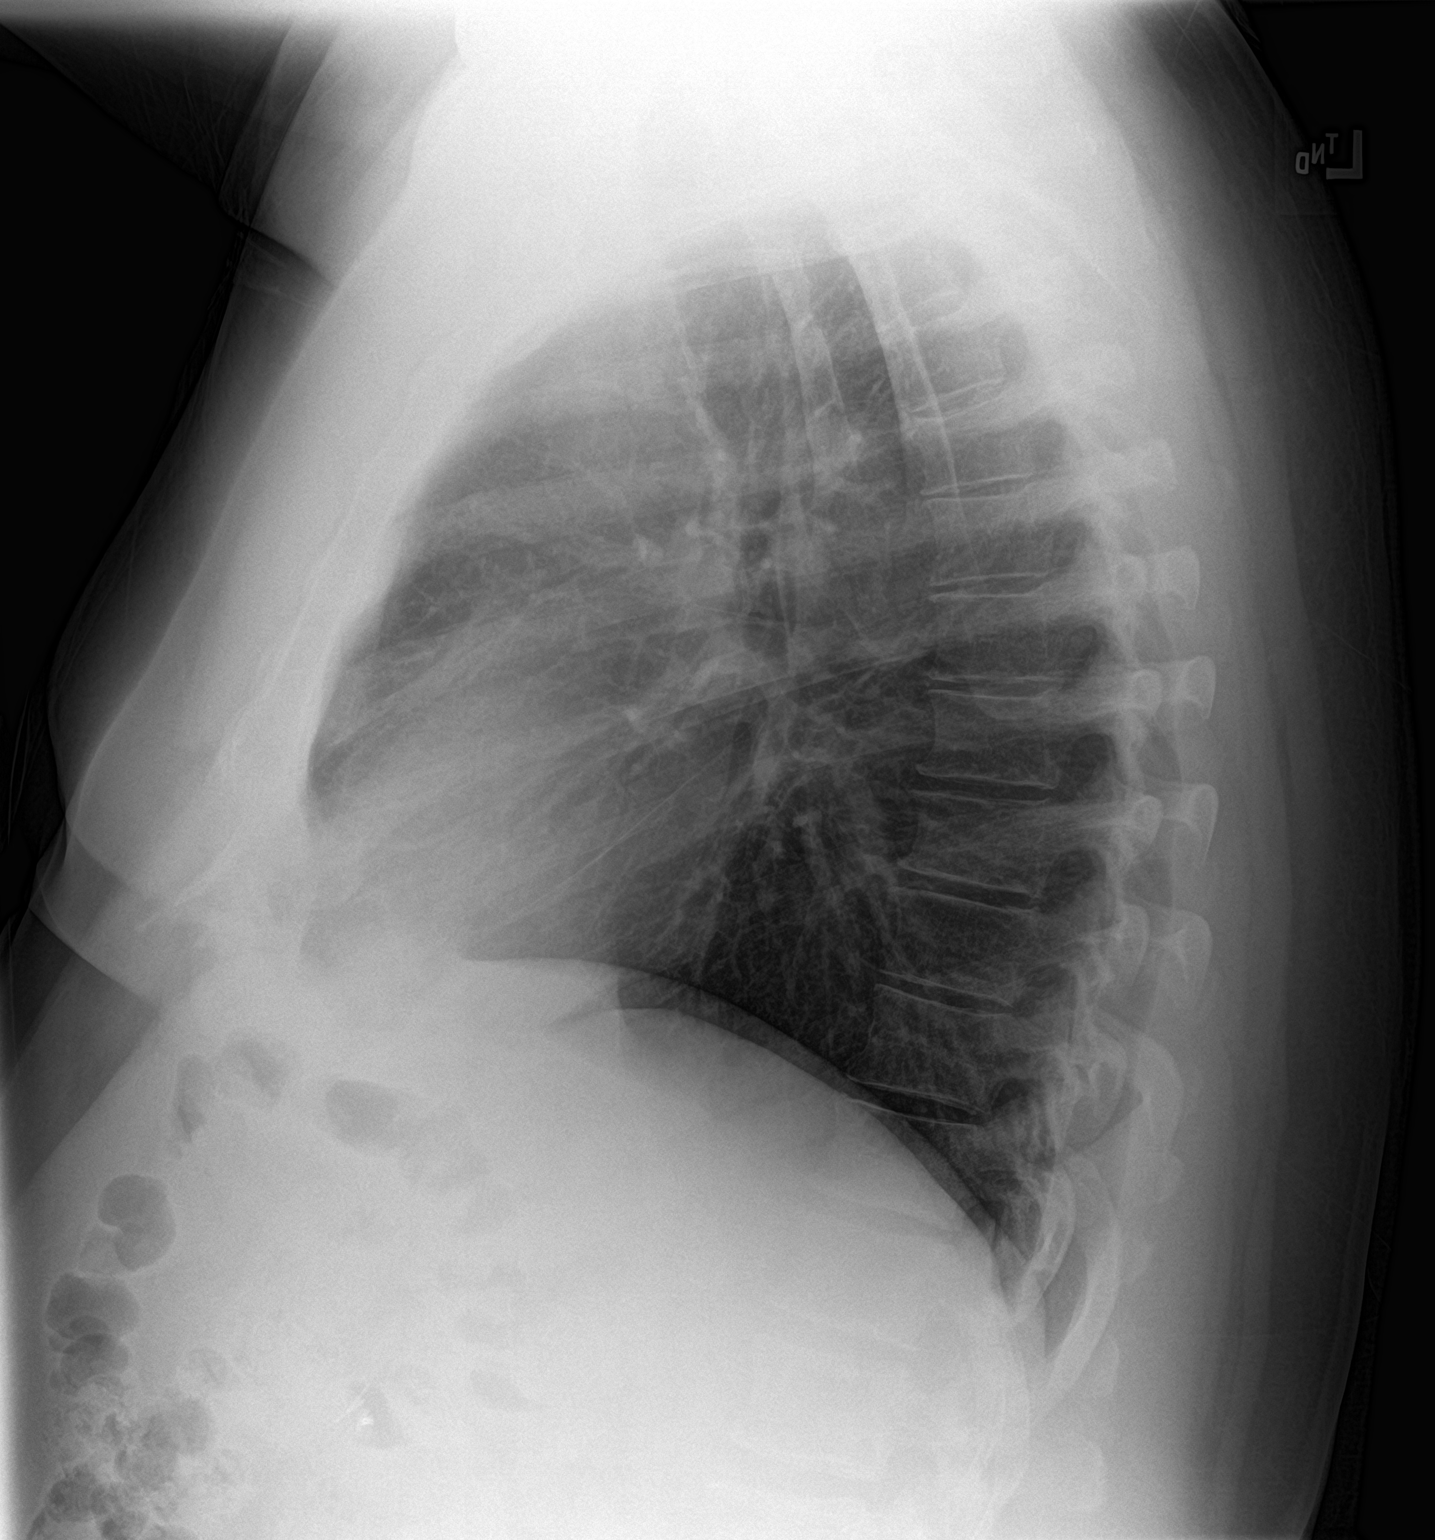

[2 of 2 positions shown; findings below may reference images not displayed]

FINDINGS: The heart size and mediastinal contours are within normal limits.
Both lungs are clear. The visualized skeletal structures are
unremarkable.
IMPRESSION: No active cardiopulmonary disease.

## 2023-03-23 ENCOUNTER — Ambulatory Visit: Payer: Self-pay | Admitting: Internal Medicine

## 2023-07-03 ENCOUNTER — Encounter: Payer: Self-pay | Admitting: Family Medicine

## 2023-07-03 ENCOUNTER — Ambulatory Visit: Payer: Self-pay | Admitting: Family Medicine

## 2023-07-03 DIAGNOSIS — Z113 Encounter for screening for infections with a predominantly sexual mode of transmission: Secondary | ICD-10-CM

## 2023-07-03 LAB — HM HIV SCREENING LAB: HM HIV Screening: NEGATIVE

## 2023-07-03 NOTE — Progress Notes (Signed)
 [facility] Department STI clinic [address] [place] [address] Main phone: [phone]  STI screening visit  Subjective:  Sergio Fleming is a 58 y.o. male being seen today for an STI screening visit. The patient reports they do not have symptoms.    Patient has the following medical conditions:  Patient Active Problem List   Diagnosis Date Noted   History of colonic polyps    Polyp of ascending colon    Encounter for Department of Transportation (DOT) examination for trucking license [id number]/[id number]   Uncontrolled type 2 diabetes mellitus with hyperglycemia (HCC) 12/18/2018   Hyperlipidemia associated with type 2 diabetes mellitus (HCC) 12/18/2018   Class 2 severe obesity due to excess calories with serious comorbidity and body mass index (BMI) of 37.0 to 37.9 in adult [facility]) 12/17/2018   OSA (obstructive sleep apnea) 03/14/2017   Forearm mass 09/21/2015   Special screening for malignant neoplasms, colon    Benign neoplasm of sigmoid colon    Benign neoplasm of descending colon    Chief Complaint  Patient presents with   SEXUALLY TRANSMITTED DISEASE    HPI Patient reports desire for STI screening/asymptomatic.  See flowsheet for further details and programmatic requirements  Hyperlink available at the top of the signed note in blue.  Flow sheet content below:  Pregnancy Intention Screening Does the patient want to become pregnant in the next year?: No Does the patient's partner want to become pregnant in the next year?: No Would the patient like to discuss contraceptive options today?: N/A All Patients Anyone smoke around pt and/or pt's children?: No Reason For STD Screen STD Screening: Is asymptomatic Have you ever had an STD?: No History of Antibiotic use in the past 2 weeks?: No STD Symptoms Denies all: Yes Risk Factors for Hep B Household, sexual, or needle sharing contact of a person infected with Hep B: No Sexual contact with  a person who uses drugs not as prescribed?: No Currently or Ever used drugs not as prescribed: No HIV Positive: No PRep Patient: No Men who have sex with men: No Have Hepatitis C: No History of Incarceration: No History of Homeslessness?: No Anal sex following anal drug use?: No Risk Factors for Hep C Currently using drugs not as prescribed: No Sexual partner(s) currently using drugs as not prescribed: No History of drug use: No HIV Positive: No People with a history of incarceration: No People born between the years of 12 and 95: Yes (hep C testing done in [place]) Abuse History Has patient ever been abused physically?: No Has patient ever been abused sexually?: No Does patient feel they have a problem with Anxiety?: No Does patient feel they have a problem with Depression?: No Referral to Behavioral Health: No Counseling Patient counseled to use condoms with all sex: Condoms declined RTC in 2-3 weeks for test results: Yes Clinic will call if test results abnormal before test result appt.: Yes Test results given to patient Patient counseled to use condoms with all sex: Condoms declined  Screening for MPX risk: Does the patient have an unexplained rash? No Is the patient MSM? No Does the patient endorse multiple sex partners or anonymous sex partners? No Did the patient have close or sexual contact with a person diagnosed with MPX? No Has the patient traveled outside the [place]  where MPX is endemic? No Is there a high clinical suspicion for MPX-- evidenced by one of the following No  -Unlikely to be chickenpox  -Lymphadenopathy  -Rash  that present in same phase of evolution on any given body part  STI screening history: Last HIV test per patient/review of record was  Lab Results  Component Value Date   HMHIVSCREEN Negative - Validated 05/05/2022    Lab Results  Component Value Date   HIV NON-REACTIVE 03/14/2017    Last HEPC test per patient/review of record was  Lab  Results  Component Value Date   HMHEPCSCREEN Negative-Validated 05/05/2022   No components found for: HEPC   Last HEPB test per patient/review of record was No components found for: HMHEPBSCREEN   Fertility: Does the patient or their partner desires a pregnancy in the next year? No  Immunization History  Administered Date(s) Administered   Influenza,inj,Quad PF,6+ Mos 12/17/2018   Tdap 01/08/2019   Zoster Recombinant(Shingrix) 11/22/2017    The following portions of the patient's history were reviewed and updated as appropriate: allergies, current medications, past medical history, past social history, past surgical history and problem list.  Objective:  There were no vitals filed for this visit.  Physical Exam Vitals and nursing note reviewed.  Constitutional:      Appearance: Normal appearance.  HENT:     Head: Normocephalic and atraumatic.     Mouth/Throat:     Mouth: Mucous membranes are moist.     Pharynx: No oropharyngeal exudate or posterior oropharyngeal erythema.   Eyes:     General:        Right eye: No discharge.        Left eye: No discharge.     Conjunctiva/sclera:     Right eye: Right conjunctiva is not injected. No exudate.    Left eye: Left conjunctiva is not injected. No exudate.  Pulmonary:     Effort: Pulmonary effort is normal.  Abdominal:     General: Abdomen is flat.     Palpations: Abdomen is soft. There is no hepatomegaly or mass.     Tenderness: There is no abdominal tenderness. There is no rebound.  Genitourinary:    Comments: Declined genital exam- asymptomatic Lymphadenopathy:     Cervical: No cervical adenopathy.     Upper Body:     Right upper body: No supraclavicular or axillary adenopathy.     Left upper body: No supraclavicular or axillary adenopathy.   Skin:    General: Skin is warm and dry.     Findings: Lesion present.     Comments: Irregular, black/brown, scaly lesion on back, encouraged PCP visit and gave PCP list    Neurological:     Mental Status: He is alert and oriented to person, place, and time.      Assessment and Plan:  Sergio Fleming is a 58 y.o. male presenting to the [facility] Department for STI screening  1. Screening for venereal disease (Primary) - Chlamydia/GC NAA, Confirmation - Gonococcus culture - HIV [place] [facility] - Syphilis Serology, [facility]   Patient does not have STI symptoms Patient accepted the following screenings: oral GC culture, urine CT/GC, HIV, and RPR Patient meets criteria for HepB screening? No. Ordered? no Patient meets criteria for HepC screening? No. Ordered? No, previously had hepC testing at PCP Recommended condom use with all sex Discussed importance of condom use for STI prevention  Treat positive test results per standing order. Discussed time line for [facility] results and that patient will be called with positive results and encouraged patient to call if he had not heard in 2 weeks Recommended repeat testing in 3  months with positive results. Recommended returning for continued or worsening symptoms.   Return if symptoms worsen or fail to improve, for STI screening.  No future appointments.  Verneta Bers, [place]

## 2023-07-06 ENCOUNTER — Ambulatory Visit: Payer: Self-pay

## 2023-07-06 LAB — CHLAMYDIA/GC NAA, CONFIRMATION
Chlamydia trachomatis, NAA: NEGATIVE
Neisseria gonorrhoeae, NAA: NEGATIVE

## 2023-07-08 LAB — GONOCOCCUS CULTURE

## 2023-07-13 ENCOUNTER — Encounter: Payer: Self-pay | Admitting: Family Medicine
